# Patient Record
Sex: Female | Born: 1980 | Race: Black or African American | Hispanic: No | Smoking: Former smoker
Health system: Southern US, Community
[De-identification: ages and names within clinical notes are randomized; demographics above are authoritative.]

## PROBLEM LIST (undated history)

## (undated) DIAGNOSIS — E119 Type 2 diabetes mellitus without complications: Secondary | ICD-10-CM

## (undated) DIAGNOSIS — M542 Cervicalgia: Secondary | ICD-10-CM

## (undated) DIAGNOSIS — D649 Anemia, unspecified: Secondary | ICD-10-CM

## (undated) DIAGNOSIS — F32A Depression, unspecified: Secondary | ICD-10-CM

## (undated) DIAGNOSIS — I10 Essential (primary) hypertension: Secondary | ICD-10-CM

## (undated) DIAGNOSIS — M549 Dorsalgia, unspecified: Secondary | ICD-10-CM

## (undated) HISTORY — PX: OTHER SURGICAL HISTORY: SHX169

## (undated) HISTORY — PX: ABLATION: SHX5711

## (undated) HISTORY — PX: DILATION AND CURETTAGE OF UTERUS: SHX78

## (undated) HISTORY — DX: Cervicalgia: M54.2

## (undated) HISTORY — DX: Dorsalgia, unspecified: M54.9

---
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
## 2018-07-11 DIAGNOSIS — E1043 Type 1 diabetes mellitus with diabetic autonomic (poly)neuropathy: Secondary | ICD-10-CM | POA: Insufficient documentation

## 2020-07-31 ENCOUNTER — Emergency Department
Admission: EM | Admit: 2020-07-31 | Discharge: 2020-07-31 | Disposition: A | Discharge status: Home / Self Care | Payer: Managed Care, Other (non HMO) | Attending: Emergency Medicine | Admitting: Emergency Medicine

## 2020-07-31 ENCOUNTER — Other Ambulatory Visit: Payer: Self-pay

## 2020-07-31 ENCOUNTER — Encounter: Payer: Self-pay | Admitting: Emergency Medicine

## 2020-07-31 DIAGNOSIS — B379 Candidiasis, unspecified: Secondary | ICD-10-CM | POA: Diagnosis not present

## 2020-07-31 DIAGNOSIS — A6004 Herpesviral vulvovaginitis: Secondary | ICD-10-CM | POA: Diagnosis not present

## 2020-07-31 DIAGNOSIS — N760 Acute vaginitis: Secondary | ICD-10-CM

## 2020-07-31 DIAGNOSIS — B9689 Other specified bacterial agents as the cause of diseases classified elsewhere: Secondary | ICD-10-CM | POA: Insufficient documentation

## 2020-07-31 DIAGNOSIS — N898 Other specified noninflammatory disorders of vagina: Secondary | ICD-10-CM | POA: Diagnosis present

## 2020-07-31 LAB — URINALYSIS, COMPLETE (UACMP) WITH MICROSCOPIC
Bilirubin Urine: NEGATIVE
Glucose, UA: 50 mg/dL — AB
Hgb urine dipstick: NEGATIVE
Ketones, ur: NEGATIVE mg/dL
Nitrite: NEGATIVE
Protein, ur: NEGATIVE mg/dL
Specific Gravity, Urine: 1.015 (ref 1.005–1.030)
pH: 6 (ref 5.0–8.0)

## 2020-07-31 LAB — WET PREP, GENITAL
Sperm: NONE SEEN
Trich, Wet Prep: NONE SEEN
Yeast Wet Prep HPF POC: NONE SEEN

## 2020-07-31 LAB — CHLAMYDIA/NGC RT PCR (ARMC ONLY)
Chlamydia Tr: NOT DETECTED
N gonorrhoeae: NOT DETECTED

## 2020-07-31 MED ORDER — VALACYCLOVIR HCL 1 G PO TABS: 1000.0000 mg | ORAL_TABLET | Freq: Two times a day (BID) | ORAL | 0 refills | Status: AC

## 2020-07-31 MED ORDER — FLUCONAZOLE 150 MG PO TABS: 150.0000 mg | ORAL_TABLET | Freq: Once | ORAL | 0 refills | Status: AC

## 2020-07-31 MED ORDER — METRONIDAZOLE 500 MG PO TABS: 500.0000 mg | ORAL_TABLET | Freq: Two times a day (BID) | ORAL | 0 refills | Status: AC

## 2020-07-31 NOTE — ED Triage Notes (Signed)
Pt reports an abscess, bump or something on her vaginal area for the last week. Pt reports area is getting painful. Provider in room to see pt with RN

## 2020-07-31 NOTE — Discharge Instructions (Addendum)
You were seen today for a vaginal ulcer, vaginal discharge and itching.  Your exam is consistent with genital herpes and bacterial vaginosis.  I am putting you on Valtrex which is an antiviral to treat the genital herpes twice daily for the next 7 days.  I will also put you on an antibiotic twice daily for the next 7 days for the bacterial infection. Avoid alcohol while on this antibiotic. I have sent in a Diflucan for you for antibiotic induced yeast infection.  Please follow-up with your PCP at your new patient appointment to discuss this further.

## 2020-07-31 NOTE — ED Notes (Signed)
Pt reports vaginal pain and irritation. Pt states that there is a bump in her vaginal area that is very sore. Pt states that this has been on going since Friday. Pt is in NAD.

## 2020-07-31 NOTE — ED Provider Notes (Signed)
Emergency Medicine Provider Triage Evaluation Note  Mary Barry , a 40 y.o. female  was evaluated in triage.  Pt complains of bump to groin area. She is concerned she may have herpes vs abscess. She reports worsening of pain over the last few days. Denies concern for STD but requests testing for them. Reports dysuria only with it running over the bump, denies urethral discomfort, vaginal discharge, or abdominal pain or fevers.  Review of Systems  Positive: + "bump" in groin Negative: Fevers, urethral discomfort, vaginal discharge  Physical Exam  BP 126/82 (BP Location: Left Arm)   Pulse 66   Resp 20   Ht 5\' 7"  (1.702 m)   Wt 111.1 kg   SpO2 100%   BMI 38.37 kg/m  Gen:   Awake, no distress  Resp:  Normal effort MSK:   Moves extremities without difficulty Other:    Medical Decision Making  Medically screening exam initiated at 7:43 AM.  Appropriate orders placed.  Mary Barry was informed that the remainder of the evaluation will be completed by another provider, this initial triage assessment does not replace that evaluation, and the importance of remaining in the ED until their evaluation is complete.     Noel Journey, PA 07/31/20 1017    08/02/20, MD 07/31/20 1504

## 2020-07-31 NOTE — ED Provider Notes (Addendum)
[facility] Emergency Department Provider Note ____________________________________________  Time seen: 1130  I have reviewed the triage vital signs and the nursing notes.  HISTORY  Chief Complaint  Abscess   HPI Mary Barry is a 40 y.o. female presents to the ER today with complaint of a bump in her vaginal area.  She noticed this 2 days ago.  She reports the bump is tender to touch.  She has not noticed any drainage from the area.  She reports she shaved 1 week ago and is unsure if this caused this bump to appear.  She reports some associated vaginal discharge and itching.  She denies pelvic pain, urinary frequency, urinary urgency, dysuria, blood in her urine or vaginal odor.  Her last menstrual period was 5 years ago, status post uterine ablation.  She has not taken any medications OTC for this.  She is not a sexually monogamous relationship.  History reviewed. No pertinent past medical history.  There are no problems to display for this patient.   History reviewed. No pertinent surgical history.  Prior to Admission medications   Medication Sig Start Date End Date Taking? Authorizing Provider  fluconazole (DIFLUCAN) 150 MG tablet Take 1 tablet (150 mg total) by mouth once for 1 dose. 07/31/20 07/31/20 Yes Tiquan Bouch, Salvadore Oxford, NP  metroNIDAZOLE (FLAGYL) 500 MG tablet Take 1 tablet (500 mg total) by mouth 2 (two) times daily for 7 days. 07/31/20 08/07/20 Yes Reymundo Winship, Salvadore Oxford, NP  valACYclovir (VALTREX) 1000 MG tablet Take 1 tablet (1,000 mg total) by mouth 2 (two) times daily for 7 days. 07/31/20 08/07/20 Yes BaitySalvadore Oxford, NP    Allergies Patient has no allergy information on record.  No family history on file.  Social History    Review of Systems  Constitutional: Negative for fever, chills or body. Cardiovascular: Negative for chest pain or chest tightness. Respiratory: Negative for cough or shortness of breath. Gastrointestinal: Negative for abdominal or  pelvic pain. Genitourinary: Positive for vaginal discharge and itching.  Negative for urinary urgency, frequency, dysuria, blood in her urine or vaginal odor. Musculoskeletal: Negative for back pain. Skin: Positive for lesion in the vaginal area. Neurological: Negative for headaches, focal weakness, tingling or numbness. ____________________________________________  PHYSICAL EXAM:  VITAL SIGNS: ED Triage Vitals  Enc Vitals Group     BP 07/31/20 0742 126/82     Pulse Rate 07/31/20 0742 66     Resp 07/31/20 0742 20     Temp 07/31/20 0744 98.5 F (36.9 C)     Temp Source 07/31/20 0744 Oral     SpO2 07/31/20 0742 100 %     Weight 07/31/20 0742 245 lb (111.1 kg)     Height 07/31/20 0742 5\' 7"  (1.702 m)     Head Circumference --      Peak Flow --      Pain Score 07/31/20 0742 2     Pain Loc --      Pain Edu? --      Excl. in GC? --     Constitutional: Alert and oriented.  Obese, in no distress. Head: Normocephalic Eyes: Normal extraocular movements Hematological/Lymphatic/Immunological: No femoral lymphadenopathy. Cardiovascular: Normal rate, regular rhythm.  Respiratory: Normal respiratory effort. No wheezes/rales/rhonchi. Gastrointestinal: Soft and nontender.  Active bowel sounds. Pelvic: Grouped ulcerations noted on clitoral hood. Musculoskeletal: No difficulty with gait. Neurologic:  Normal speech and language. No gross focal neurologic deficits are appreciated. ____________________________________________    Labs Reviewed  WET PREP, GENITAL - Abnormal;  Notable for the following components:      Result Value   Clue Cells Wet Prep HPF POC PRESENT (*)    WBC, Wet Prep HPF POC FEW (*)    All other components within normal limits  URINALYSIS, COMPLETE (UACMP) WITH MICROSCOPIC - Abnormal; Notable for the following components:   Color, Urine YELLOW (*)    APPearance HAZY (*)    Glucose, UA 50 (*)    Leukocytes,Ua TRACE (*)    Bacteria, UA FEW (*)    All other components  within normal limits  CHLAMYDIA/NGC RT PCR ([facility] ONLY)             ____________________________________________  INITIAL IMPRESSION / ASSESSMENT AND PLAN / ED COURSE  Vaginal Ulceration, Vaginal Discharge, Vaginal Itching:  DDx include genital herpes, bacterial vaginosis, yeast vaginitis, gonorrhea, chlamydia, trichomonas Urinalysis normal Wet prep positive for clue cells indicating bacterial vaginitis Exam consistent with genital herpes RX for Valtrex 1 gm PO BID x 7 days RX for Metronidazole 500 BID x 7 days She requests Fluconazole 150 mg PO x 1 for antibiotic induced yeast infection Advised her to follow up with her PCP for further evaluation if needed ____________________________________________  FINAL CLINICAL IMPRESSION(S) / ED DIAGNOSES  Final diagnoses:  Herpes simplex vulvovaginitis  Bacterial vaginitis  Antibiotic-induced yeast infection      Lorre Munroe, NP 07/31/20 1214    Lorre Munroe, NP 07/31/20 1234    Chesley Noon, MD 07/31/20 1524

## 2020-09-06 DIAGNOSIS — G8929 Other chronic pain: Secondary | ICD-10-CM

## 2020-09-06 DIAGNOSIS — I517 Cardiomegaly: Secondary | ICD-10-CM

## 2020-09-06 HISTORY — DX: Cardiomegaly: I51.7

## 2020-09-06 HISTORY — DX: Other chronic pain: G89.29

## 2021-01-25 ENCOUNTER — Other Ambulatory Visit: Payer: Self-pay | Admitting: Gastroenterology

## 2021-01-25 DIAGNOSIS — R1012 Left upper quadrant pain: Secondary | ICD-10-CM

## 2021-01-25 DIAGNOSIS — R1011 Right upper quadrant pain: Secondary | ICD-10-CM

## 2021-02-10 ENCOUNTER — Encounter
Admission: RE | Admit: 2021-02-10 | Discharge: 2021-02-10 | Disposition: A | Discharge status: Home / Self Care | Payer: BC Managed Care – PPO | Source: Ambulatory Visit | Attending: Gastroenterology | Admitting: Gastroenterology

## 2021-02-10 DIAGNOSIS — R1012 Left upper quadrant pain: Secondary | ICD-10-CM | POA: Diagnosis present

## 2021-02-10 DIAGNOSIS — R1011 Right upper quadrant pain: Secondary | ICD-10-CM | POA: Diagnosis present

## 2021-02-10 IMAGING — NM NM GASTRIC EMPTYING
5 series · 16 of 16 positions shown · non-contrast
Comparison: None.

CLINICAL DATA: Abdominal pain.

EXAM:
NUCLEAR MEDICINE GASTRIC EMPTYING SCAN
TECHNIQUE: After oral ingestion of radiolabeled meal, sequential abdominal
images were obtained for 4 hours. Percentage of activity emptying
the stomach was calculated at 1 hour, 2 hour, 3 hour, and 4 hours.
RADIOPHARMACEUTICALS:  2.27 mCi [T1] sulfur colloid in
standardized meal

[Series 1000: gastric statics · 3.90mm/px · 2 of 2 frames shown (1 of 4)]
[frame 1/2]
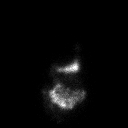
[frame 2/2]
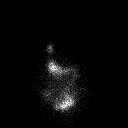

[Series 1000: gastric statics · 3.90mm/px · 2 of 2 frames shown (2 of 4)]
[frame 1/2]
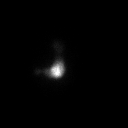
[frame 2/2]
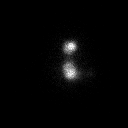

[Series 1000: gastric statics (results) · 3.90mm/px · 4 acquisitions, 8 frames shown]
[im 1/4]
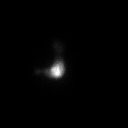
[im 1/4]
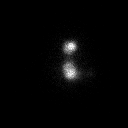
[im 2/4]
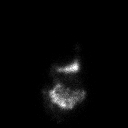
[im 2/4]
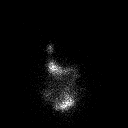
[im 3/4]
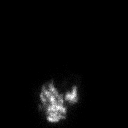
[im 3/4]
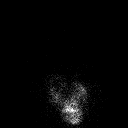
[im 4/4]
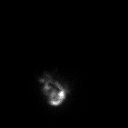
[im 4/4]
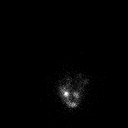

[Series 1000: gastric statics · 3.90mm/px · 2 of 2 frames shown (3 of 4)]
[frame 1/2]
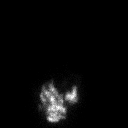
[frame 2/2]
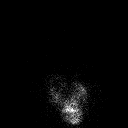

[Series 1000: gastric statics · 3.90mm/px · 2 of 2 frames shown (4 of 4)]
[frame 1/2]
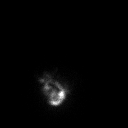
[frame 2/2]
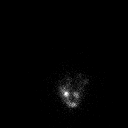

[16 of 16 positions shown; findings below may reference images not displayed]

FINDINGS: Expected location of the stomach in the left upper quadrant.
Ingested meal empties the stomach gradually over the course of the
study.

74% emptied at 1 hr ( normal >= 10%)

99% emptied at 2 hr ( normal >= 40%)

99% emptied at 3 hr ( normal >= 70%)
IMPRESSION: Normal gastric emptying study.

## 2021-02-10 MED ORDER — TECHNETIUM TC 99M SULFUR COLLOID
2.0000 | Freq: Once | INTRAVENOUS | Status: AC
  Administered 2021-02-10: 08:00:00 2.27 via ORAL

## 2021-09-25 ENCOUNTER — Other Ambulatory Visit: Payer: Self-pay | Admitting: Physical Medicine & Rehabilitation

## 2021-09-25 DIAGNOSIS — M546 Pain in thoracic spine: Secondary | ICD-10-CM

## 2021-09-29 ENCOUNTER — Ambulatory Visit
Admission: RE | Admit: 2021-09-29 | Discharge: 2021-09-29 | Disposition: A | Discharge status: Home / Self Care | Payer: BC Managed Care – PPO | Source: Ambulatory Visit | Attending: Physical Medicine & Rehabilitation | Admitting: Physical Medicine & Rehabilitation

## 2021-09-29 DIAGNOSIS — M546 Pain in thoracic spine: Secondary | ICD-10-CM | POA: Insufficient documentation

## 2021-10-03 ENCOUNTER — Other Ambulatory Visit: Payer: Self-pay | Admitting: Physical Medicine & Rehabilitation

## 2021-10-03 DIAGNOSIS — M542 Cervicalgia: Secondary | ICD-10-CM

## 2021-10-04 DIAGNOSIS — I10 Essential (primary) hypertension: Secondary | ICD-10-CM | POA: Insufficient documentation

## 2021-10-11 ENCOUNTER — Ambulatory Visit
Admission: RE | Admit: 2021-10-11 | Discharge: 2021-10-11 | Disposition: A | Discharge status: Home / Self Care | Payer: BC Managed Care – PPO | Source: Ambulatory Visit | Attending: Physical Medicine & Rehabilitation | Admitting: Physical Medicine & Rehabilitation

## 2021-10-11 DIAGNOSIS — M542 Cervicalgia: Secondary | ICD-10-CM | POA: Insufficient documentation

## 2022-11-01 ENCOUNTER — Encounter: Payer: Self-pay | Admitting: Family Medicine

## 2022-11-01 DIAGNOSIS — Z1231 Encounter for screening mammogram for malignant neoplasm of breast: Secondary | ICD-10-CM

## 2023-04-09 ENCOUNTER — Telehealth: Payer: Self-pay | Admitting: Plastic Surgery

## 2023-04-09 NOTE — Telephone Encounter (Signed)
 called due to Dr D in sx pt agreed to come in at 330 pm to see Dr D called 04-09-23)

## 2023-04-10 ENCOUNTER — Ambulatory Visit: Payer: BC Managed Care – PPO | Admitting: Plastic Surgery

## 2023-04-10 VITALS — BP 119/71 | HR 88 | Ht 67.0 in | Wt 243.0 lb

## 2023-04-10 DIAGNOSIS — N62 Hypertrophy of breast: Secondary | ICD-10-CM

## 2023-04-10 DIAGNOSIS — F17210 Nicotine dependence, cigarettes, uncomplicated: Secondary | ICD-10-CM

## 2023-04-10 DIAGNOSIS — G8929 Other chronic pain: Secondary | ICD-10-CM

## 2023-04-10 DIAGNOSIS — M542 Cervicalgia: Secondary | ICD-10-CM

## 2023-04-10 DIAGNOSIS — M545 Low back pain, unspecified: Secondary | ICD-10-CM

## 2023-04-10 DIAGNOSIS — M546 Pain in thoracic spine: Secondary | ICD-10-CM

## 2023-04-10 DIAGNOSIS — M549 Dorsalgia, unspecified: Secondary | ICD-10-CM | POA: Insufficient documentation

## 2023-04-10 DIAGNOSIS — Z803 Family history of malignant neoplasm of breast: Secondary | ICD-10-CM

## 2023-04-10 DIAGNOSIS — Z6837 Body mass index (BMI) 37.0-37.9, adult: Secondary | ICD-10-CM

## 2023-04-10 HISTORY — DX: Cervicalgia: M54.2

## 2023-04-10 HISTORY — DX: Hypertrophy of breast: N62
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
## 2023-06-06 ENCOUNTER — Other Ambulatory Visit: Payer: Self-pay

## 2023-06-06 ENCOUNTER — Encounter: Payer: Self-pay | Admitting: Physical Therapy

## 2023-06-06 ENCOUNTER — Ambulatory Visit: Attending: Plastic Surgery | Admitting: Physical Therapy

## 2023-06-06 DIAGNOSIS — G8929 Other chronic pain: Secondary | ICD-10-CM | POA: Diagnosis not present

## 2023-06-06 DIAGNOSIS — M542 Cervicalgia: Secondary | ICD-10-CM | POA: Insufficient documentation

## 2023-06-06 DIAGNOSIS — N62 Hypertrophy of breast: Secondary | ICD-10-CM | POA: Diagnosis not present

## 2023-06-06 DIAGNOSIS — M546 Pain in thoracic spine: Secondary | ICD-10-CM | POA: Insufficient documentation

## 2023-06-06 DIAGNOSIS — R293 Abnormal posture: Secondary | ICD-10-CM | POA: Insufficient documentation

## 2023-06-06 DIAGNOSIS — M6281 Muscle weakness (generalized): Secondary | ICD-10-CM | POA: Insufficient documentation
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
## 2023-06-19 ENCOUNTER — Ambulatory Visit: Attending: Plastic Surgery | Admitting: Physical Therapy

## 2023-06-19 ENCOUNTER — Encounter: Payer: Self-pay | Admitting: Physical Therapy

## 2023-06-19 DIAGNOSIS — R293 Abnormal posture: Secondary | ICD-10-CM | POA: Diagnosis present

## 2023-06-19 DIAGNOSIS — M546 Pain in thoracic spine: Secondary | ICD-10-CM | POA: Diagnosis present

## 2023-06-19 DIAGNOSIS — M542 Cervicalgia: Secondary | ICD-10-CM | POA: Insufficient documentation

## 2023-06-19 DIAGNOSIS — M6281 Muscle weakness (generalized): Secondary | ICD-10-CM | POA: Insufficient documentation
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
## 2023-06-25 ENCOUNTER — Ambulatory Visit: Admitting: Physical Therapy

## 2023-07-05 ENCOUNTER — Ambulatory Visit: Admitting: Physical Therapy

## 2023-07-05 ENCOUNTER — Encounter: Payer: Self-pay | Admitting: Physical Therapy

## 2023-07-05 DIAGNOSIS — M546 Pain in thoracic spine: Secondary | ICD-10-CM

## 2023-07-05 DIAGNOSIS — M542 Cervicalgia: Secondary | ICD-10-CM
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
## 2023-07-09 ENCOUNTER — Ambulatory Visit: Payer: BC Managed Care – PPO | Admitting: Student

## 2023-07-09 VITALS — BP 134/85 | HR 87 | Wt 248.0 lb

## 2023-07-09 DIAGNOSIS — M546 Pain in thoracic spine: Secondary | ICD-10-CM

## 2023-07-09 DIAGNOSIS — N62 Hypertrophy of breast: Secondary | ICD-10-CM

## 2023-07-09 DIAGNOSIS — G8929 Other chronic pain: Secondary | ICD-10-CM

## 2023-07-09 NOTE — Progress Notes (Signed)
   Referring Provider Lenell Query, PA-C [id number] [place],  [place] [address]   CC:  Chief Complaint  Patient presents with   Follow-up      Mary Barry is an 43 y.o. female.  HPI: Patient is a 43 year old female with history of macromastia.  She presents to the clinic today to further discuss optimizing her for surgery.  Patient was seen for initial consult by Dr. Orin Birk on 04/10/2023.  At this visit, she complained of back and neck pain due to her large breasts.  Her BMI at that time was 37.6 kg/m.  Her STN was 36 cm bilaterally.  Her preoperative bra size was a DDD cup.  The patient reported she would like to be a B cup.  The amount of excess breast tissue to be removed at time of surgery was 800 g bilaterally.  Patient reported she was a current smoker.  Patient also stated that she was an insulin-dependent diabetic.  She was possibly starting on weight loss medication that would also help with her diabetes, but had not started yet.  Her most recent hemoglobin A1c prior to the visit was 7.4.  It was discussed with the patient that she had to be 3 months tobacco free prior to any surgery and that her hemoglobin A1c would need to be under 7.  Per chart review, patient's most recent Hgb A1C was 7.2 on 06/25/23.   Today, patient reports she is doing well. She states that she has been going to PT, but still has one more session left. She reports that although she has been going to PT, she still experiencing back and neck pain. Patient also states that she gets rashes underneath her breasts intermittently. She denies any recent fevers, chills or changes in her health. She states she would like to move forward with a breast reduction.   Patient also states that she quit smoking on March 1st. In terms of her A1C, patient states she has been working with her endocrinologist on this   Review of Systems General: Back and neck pain, intermittent rashes, denies any fevers  or chills   Physical Exam    07/09/2023    3:46 PM 04/10/2023    3:12 PM 07/31/2020   12:16 PM  Vitals with BMI  Height  5\' 7"    Weight 248 lbs 243 lbs   BMI  38.05   Systolic 134 119 956  Diastolic 85 71 85  Pulse 87 88 80    General:  No acute distress,  Alert and oriented, Non-Toxic, Normal speech and affect Psych: Normal behavior, normal mood Pulmonary: Unlabored breathing, no respiratory distress MSK: ambulatory   Assessment/Plan  Chronic bilateral thoracic back pain   Discussed with the patient that given her A1c is still elevated we cannot yet schedule surgery. I  discussed with her that an elevated A1c may put her at risk of complications after the surgery. Patient expressed understanding.   Patient states that she is seeing her endocrinologist again in August and will have a repeat A1c at that visit. Recommended that we do a telephone visit after her endocrinologist appointment to see if her A1c has lowered. If it has, discussed with patient we can submit to insurance. She expressed understanding.   Instructed the patient to call in the meantime if she has any questions or concerns.   [name] 07/09/2023, 4:12 PM

## 2023-07-10 ENCOUNTER — Encounter: Payer: Self-pay | Admitting: Physical Therapy

## 2023-07-10 ENCOUNTER — Ambulatory Visit: Admitting: Physical Therapy

## 2023-07-10 DIAGNOSIS — R293 Abnormal posture: Secondary | ICD-10-CM

## 2023-07-10 DIAGNOSIS — M542 Cervicalgia: Secondary | ICD-10-CM

## 2023-07-10 DIAGNOSIS — M546 Pain in thoracic spine: Secondary | ICD-10-CM

## 2023-07-10 DIAGNOSIS — M6281 Muscle weakness (generalized): Secondary | ICD-10-CM
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
## 2023-09-13 ENCOUNTER — Other Ambulatory Visit: Payer: Self-pay | Admitting: Family Medicine

## 2023-09-13 DIAGNOSIS — R9389 Abnormal findings on diagnostic imaging of other specified body structures: Secondary | ICD-10-CM

## 2023-09-13 DIAGNOSIS — M542 Cervicalgia: Secondary | ICD-10-CM

## 2023-09-16 ENCOUNTER — Inpatient Hospital Stay
Admission: RE | Admit: 2023-09-16 | Discharge: 2023-09-16 | Disposition: A | Discharge status: Home / Self Care | Payer: Self-pay | Source: Ambulatory Visit | Attending: Orthopedic Surgery | Admitting: Orthopedic Surgery

## 2023-09-16 ENCOUNTER — Other Ambulatory Visit: Payer: Self-pay | Admitting: Family Medicine

## 2023-09-16 DIAGNOSIS — Z049 Encounter for examination and observation for unspecified reason: Secondary | ICD-10-CM

## 2023-09-17 ENCOUNTER — Ambulatory Visit
Admission: RE | Admit: 2023-09-17 | Discharge: 2023-09-17 | Disposition: A | Discharge status: Home / Self Care | Source: Ambulatory Visit | Attending: Family Medicine | Admitting: Family Medicine

## 2023-09-17 DIAGNOSIS — M542 Cervicalgia: Secondary | ICD-10-CM | POA: Diagnosis present

## 2023-09-17 DIAGNOSIS — R9389 Abnormal findings on diagnostic imaging of other specified body structures: Secondary | ICD-10-CM | POA: Diagnosis present
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
## 2023-09-19 ENCOUNTER — Encounter: Payer: Self-pay | Admitting: Orthopedic Surgery

## 2023-09-19 ENCOUNTER — Telehealth: Payer: Self-pay | Admitting: Orthopedic Surgery

## 2023-09-19 ENCOUNTER — Inpatient Hospital Stay
Admission: RE | Admit: 2023-09-19 | Discharge: 2023-09-19 | Disposition: A | Discharge status: Home / Self Care | Payer: Self-pay | Source: Ambulatory Visit | Attending: Orthopedic Surgery | Admitting: Orthopedic Surgery

## 2023-09-19 ENCOUNTER — Ambulatory Visit: Admitting: Orthopedic Surgery

## 2023-09-19 VITALS — BP 132/78 | Ht 67.0 in | Wt 253.0 lb

## 2023-09-19 DIAGNOSIS — Z049 Encounter for examination and observation for unspecified reason: Secondary | ICD-10-CM

## 2023-09-19 DIAGNOSIS — Z981 Arthrodesis status: Secondary | ICD-10-CM

## 2023-09-19 DIAGNOSIS — M4802 Spinal stenosis, cervical region: Secondary | ICD-10-CM

## 2023-09-19 DIAGNOSIS — H539 Unspecified visual disturbance: Secondary | ICD-10-CM

## 2023-09-19 DIAGNOSIS — M4722 Other spondylosis with radiculopathy, cervical region: Secondary | ICD-10-CM | POA: Diagnosis not present

## 2023-09-19 DIAGNOSIS — M5412 Radiculopathy, cervical region: Secondary | ICD-10-CM

## 2023-09-19 DIAGNOSIS — M47812 Spondylosis without myelopathy or radiculopathy, cervical region: Secondary | ICD-10-CM

## 2023-09-19 NOTE — Telephone Encounter (Signed)
 Patient advised.

## 2023-09-19 NOTE — Telephone Encounter (Signed)
 Please call and let her know that PCP Hezzie Quan PA-C) said she can take aspirin 81mg  daily.   Thanks!

## 2023-09-19 NOTE — Patient Instructions (Signed)
 It was so nice to see you today. Thank you so much for coming in.    You have some wear and tear in your neck and I am worried your fusion may not be healed.   I want to get a CT of your neck to look into things further. I also want to get an CTA of your head and neck to look into the vessels.   We will get this approved through your insurance and [facility] will call you to schedule the appointment.   We want you to start aspirin 81mg  daily, but want to get clearance from your PCP. I sent him a message. Do not start it until you back from him.   I put in an urgent referral to [facility] for further evaluation. They should call you to schedule an appointment or you can call them at [phone].   Once I have the results back, we will schedule you a follow up.   Please do not hesitate to call if you have any questions or concerns. You can also message me in MyChart.   Glade Boys PA-C [phone]     The physicians and staff at [facility] at [facility] are committed to providing excellent care. You may receive a survey asking for feedback about your experience at our office. We value you your feedback and appreciate you taking the time to to fill it out. The [facility] leadership team is also available to discuss your experience in person, feel free to contact us  [phone].

## 2023-09-20 MED ORDER — DIAZEPAM 2 MG PO TABS: 2.0000 mg | ORAL_TABLET | Freq: Once | ORAL | 0 refills | Status: DC | PRN

## 2023-09-23 ENCOUNTER — Ambulatory Visit
Admission: RE | Admit: 2023-09-23 | Discharge: 2023-09-23 | Disposition: A | Discharge status: Home / Self Care | Source: Ambulatory Visit | Attending: Orthopedic Surgery | Admitting: Orthopedic Surgery

## 2023-09-23 DIAGNOSIS — Z049 Encounter for examination and observation for unspecified reason: Secondary | ICD-10-CM | POA: Diagnosis present

## 2023-09-23 DIAGNOSIS — M47812 Spondylosis without myelopathy or radiculopathy, cervical region: Secondary | ICD-10-CM | POA: Insufficient documentation

## 2023-09-23 DIAGNOSIS — H539 Unspecified visual disturbance: Secondary | ICD-10-CM | POA: Insufficient documentation

## 2023-09-23 DIAGNOSIS — M5412 Radiculopathy, cervical region: Secondary | ICD-10-CM | POA: Insufficient documentation

## 2023-09-23 DIAGNOSIS — Z981 Arthrodesis status: Secondary | ICD-10-CM | POA: Insufficient documentation

## 2023-09-23 MED ORDER — IOHEXOL 350 MG/ML SOLN
75.0000 mL | Freq: Once | INTRAVENOUS | Status: AC | PRN
  Administered 2023-09-23 (×2): 75 mL via INTRAVENOUS

## 2023-09-24 ENCOUNTER — Encounter: Payer: Self-pay | Admitting: Neurology
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
## 2023-09-30 ENCOUNTER — Telehealth: Payer: Self-pay | Admitting: Orthopedic Surgery

## 2023-09-30 NOTE — Telephone Encounter (Signed)
 CTA of head and neck dated 09/23/23:  FINDINGS:   CT HEAD:   BRAIN: No acute intraparenchymal hemorrhage. No mass lesion. No CT evidence for acute territorial infarct. No midline shift or extra-axial collection.   VENTRICLES: No hydrocephalus.   ORBITS: The orbits are unremarkable.   SINUSES AND MASTOIDS: The paranasal sinuses and mastoid air cells are clear.   CTA NECK:   COMMON CAROTID ARTERIES: No significant stenosis. No dissection or occlusion.   INTERNAL CAROTID ARTERIES: No stenosis by NASCET criteria. Minimal atherosclerotic calcification in the carotid siphons without significant stenosis. No dissection or occlusion.   VERTEBRAL ARTERIES: No significant stenosis. No dissection or occlusion.   CTA HEAD:   ANTERIOR CEREBRAL ARTERIES: No significant stenosis. No occlusion. No aneurysm.   MIDDLE CEREBRAL ARTERIES: No significant stenosis. No occlusion. No aneurysm.   POSTERIOR CEREBRAL ARTERIES: No significant stenosis. No occlusion. No aneurysm. There are small posterior communicating arteries bilaterally.   BASILAR ARTERY: No significant stenosis. No occlusion. No aneurysm.   OTHER:   SOFT TISSUES: No acute finding. No mass or lymphadenopathy.   BONES: C5 to C7 ACDF. Right greater than left neural foraminal stenosis at C6-7 due to uncovertebral spurring as detailed on the recent prior MRI.   IMPRESSION: 1. No large vessel occlusion, significant stenosis, dissection, or aneurysm in the head and neck. 2. Unremarkable noncontrast CT appearance of the brain.   Electronically signed by: Dasie Hamburg MD 09/30/2023 03:25 PM EDT RP Workstation: [id number]   Called patient to discuss findings. She will keep scheduled appointment with neurology on 10/02/23.   Does not appear to be fused at C6-C7. Will reviewed with Dr. Clois and set up phone visit with her on Friday to discuss further.

## 2023-10-02 ENCOUNTER — Ambulatory Visit: Admitting: Neurology

## 2023-10-02 ENCOUNTER — Encounter: Payer: Self-pay | Admitting: Neurology

## 2023-10-02 VITALS — BP 138/74 | HR 82 | Resp 20 | Ht 67.0 in | Wt 254.0 lb

## 2023-10-02 DIAGNOSIS — M79602 Pain in left arm: Secondary | ICD-10-CM | POA: Diagnosis not present

## 2023-10-02 DIAGNOSIS — M542 Cervicalgia: Secondary | ICD-10-CM | POA: Diagnosis not present

## 2023-10-02 DIAGNOSIS — D649 Anemia, unspecified: Secondary | ICD-10-CM | POA: Insufficient documentation

## 2023-10-02 DIAGNOSIS — M5441 Lumbago with sciatica, right side: Secondary | ICD-10-CM

## 2023-10-02 DIAGNOSIS — M5442 Lumbago with sciatica, left side: Secondary | ICD-10-CM

## 2023-10-02 DIAGNOSIS — R29818 Other symptoms and signs involving the nervous system: Secondary | ICD-10-CM

## 2023-10-02 DIAGNOSIS — G43009 Migraine without aura, not intractable, without status migrainosus: Secondary | ICD-10-CM

## 2023-10-02 DIAGNOSIS — F32A Depression, unspecified: Secondary | ICD-10-CM | POA: Insufficient documentation

## 2023-10-02 DIAGNOSIS — G8929 Other chronic pain: Secondary | ICD-10-CM

## 2023-10-02 NOTE — Patient Instructions (Addendum)
 I saw you today for pain on your left side.   Neurosurgery was concerned there may be a torn blood vessel in your neck. Your recent scan did not show any evidence of this.  I will discuss options with neurosurgery including nerve testing or pain management referral.  We discussed a medication called nortriptyline  that can help migraines and pain in general. The most common side effects is sleepiness and dry mouth. You would like to think about this. Let me know if you'd like to try it.  If you have new difficulty speaking, face droop, numbness on one side of the body, weakness on one side of the body, or dizziness/imbalance, this could be the sign of a stroke. Don't wait, please call EMS and be evaluated at the nearest emergency room.   The physicians and staff at [facility] are committed to providing excellent care. You may receive a survey requesting feedback about your experience at our office. We strive to receive very good responses to the survey questions. If you feel that your experience would prevent you from giving the office a very good  response, please contact our office to try to remedy the situation. We may be reached at [phone]. Thank you for taking the time out of your busy day to complete the survey.  Venetia Potters, MD [facility]

## 2023-10-03 NOTE — Progress Notes (Unsigned)
 Telephone Visit- Progress Note: Referring Physician:  Cyrus Selinda Moose, PA-C [address] [place],  [place] [address]  Primary Physician:  Cyrus Selinda Moose, PA-C  This visit was performed via telephone.  Patient location: home Provider location: working from home  I spent a total of 20 minutes non-face-to-face activities for this visit on the date of this encounter including review of current clinical condition and response to treatment.    Patient has given verbal consent to this telephone visits and we reviewed the limitations of a telephone visit. Patient wishes to proceed.    Chief Complaint:  discuss results  History of Present Illness: Mary Barry is a 43 y.o. female has a history of DM, diabetic gastroparesis, cardiac hypertrophy, GERD, HTN, hyperlipidemia, gastric ulcers.    History of ACDF in 2020 and she did well.   Last seen by me on 09/19/23 for constant left sided neck pain with left arm pain into her fingers. Xrays showed possible lucency at C7 screw. She also has known moderate left foraminal stenosis C6-C7 as well.   She was sent to neurology for possible dissection. CTA of head and neck looked good. She saw Dr. Leigh on 10/02/23 and he discussed starting amitriptyline for headaches.   She continues with constant left sided neck pain with left arm into her fingers that is a little better but still constant. No right arm symptoms. She has numbness, tingling, and weakness in her left arm. She continues with intermittent headaches/pain. Still seeing black spots intermittently. She notes some swallowing issues as well that recently started when neck pain did. She did not have swallowing issues after her surgery.   She is diabetic. Last HgbA1c was 7.2 on 06/25/23. She was discharged from PT on 07/10/23.    Tobacco use:  Does not smoke.      Conservative measures:  Physical therapy: was discharged from PT on 07/10/23 for her neck and back.   Multimodal medical therapy including regular antiinflammatories: Flexeril, Gabapentin Injections: no epidural steroid injections   Past Surgery: History of ACDF in 2020.   Imajean Reaves Barry has no symptoms of cervical myelopathy.   The symptoms are causing a significant impact on the patient's life.    Exam: No exam done as this was a telephone encounter.     Imaging: CTA head and neck dated 09/23/23:  FINDINGS:   CT HEAD:   BRAIN: No acute intraparenchymal hemorrhage. No mass lesion. No CT evidence for acute territorial infarct. No midline shift or extra-axial collection.   VENTRICLES: No hydrocephalus.   ORBITS: The orbits are unremarkable.   SINUSES AND MASTOIDS: The paranasal sinuses and mastoid air cells are clear.   CTA NECK:   COMMON CAROTID ARTERIES: No significant stenosis. No dissection or occlusion.   INTERNAL CAROTID ARTERIES: No stenosis by NASCET criteria. Minimal atherosclerotic calcification in the carotid siphons without significant stenosis. No dissection or occlusion.   VERTEBRAL ARTERIES: No significant stenosis. No dissection or occlusion.   CTA HEAD:   ANTERIOR CEREBRAL ARTERIES: No significant stenosis. No occlusion. No aneurysm.   MIDDLE CEREBRAL ARTERIES: No significant stenosis. No occlusion. No aneurysm.   POSTERIOR CEREBRAL ARTERIES: No significant stenosis. No occlusion. No aneurysm. There are small posterior communicating arteries bilaterally.   BASILAR ARTERY: No significant stenosis. No occlusion. No aneurysm.   OTHER:   SOFT TISSUES: No acute finding. No mass or lymphadenopathy.   BONES: C5 to C7 ACDF. Right greater than left neural foraminal stenosis at C6-7 due  to uncovertebral spurring as detailed on the recent prior MRI.   IMPRESSION: 1. No large vessel occlusion, significant stenosis, dissection, or aneurysm in the head and neck. 2. Unremarkable noncontrast CT appearance of the brain.   Electronically  signed by: Dasie Hamburg MD 09/30/2023 03:25 PM EDT RP Workstation: [id number]  I have personally reviewed the images and agree with the above interpretation.  She does not appear to be fused at C6-C7. Above imaging reviewed with Dr. Clois prior to her visit.   Assessment and Plan: Ms. Hadessah Grennan has a history of ACDF C5-C7 in 2020. Did well after surgery.   She continues with constant left sided neck pain with left arm into her fingers that is a little better but still constant. No right arm symptoms. She has numbness, tingling, and weakness in her left arm. She continues with intermittent headaches/pain.    Xrays show possible lucency at C7 screw and in review of CTA she does not appear to be fused at C6-C7. She also has moderate left foraminal stenosis C6-C7 as well.    Neck and left arm pain likely cervical mediated. Headaches not likely cervical mediated, but may be exacerbated by cervical issues.    Treatment options discussed with patient and following plan made:   - She has failed conservative treatment including PT (was discharged), time, and medications.  - She will follow up with Dr. Clois to discuss possible surgery options. Appointment scheduled.  - She's having some new swallowing issues- not likely cervical mediated. May consider referral to ENT. Can discuss with Dr. Clois further.  - She does not smoke.  - Last HgbA1c on 06/25/23 was 7.2.  - Do not think EMG of upper extremities is necessary. Will discuss with Dr. Leigh.   Glade Boys PA-C Neurosurgery

## 2023-10-04 ENCOUNTER — Telehealth: Payer: Self-pay | Admitting: Neurology

## 2023-10-04 ENCOUNTER — Encounter: Payer: Self-pay | Admitting: Orthopedic Surgery

## 2023-10-04 ENCOUNTER — Other Ambulatory Visit: Payer: Self-pay | Admitting: Neurology

## 2023-10-04 ENCOUNTER — Ambulatory Visit (INDEPENDENT_AMBULATORY_CARE_PROVIDER_SITE_OTHER): Admitting: Orthopedic Surgery

## 2023-10-04 DIAGNOSIS — G8929 Other chronic pain: Secondary | ICD-10-CM

## 2023-10-04 DIAGNOSIS — S129XXA Fracture of neck, unspecified, initial encounter: Secondary | ICD-10-CM

## 2023-10-04 DIAGNOSIS — M47812 Spondylosis without myelopathy or radiculopathy, cervical region: Secondary | ICD-10-CM

## 2023-10-04 DIAGNOSIS — M79602 Pain in left arm: Secondary | ICD-10-CM

## 2023-10-04 DIAGNOSIS — Z981 Arthrodesis status: Secondary | ICD-10-CM | POA: Diagnosis not present

## 2023-10-04 DIAGNOSIS — M542 Cervicalgia: Secondary | ICD-10-CM

## 2023-10-04 DIAGNOSIS — M4802 Spinal stenosis, cervical region: Secondary | ICD-10-CM

## 2023-10-04 DIAGNOSIS — G43009 Migraine without aura, not intractable, without status migrainosus: Secondary | ICD-10-CM

## 2023-10-04 DIAGNOSIS — M5412 Radiculopathy, cervical region: Secondary | ICD-10-CM

## 2023-10-04 MED ORDER — NORTRIPTYLINE HCL 10 MG PO CAPS: 20.0000 mg | ORAL_CAPSULE | Freq: Every day | ORAL | 5 refills | Status: DC

## 2023-10-04 NOTE — Telephone Encounter (Signed)
 Pt called and LM with AN. She was seen yesterday by [facility]. She would like the medication that was prescribed to be sent in to her pharmacy On file

## 2023-10-07 NOTE — Progress Notes (Unsigned)
 Referring Physician:  Cyrus Selinda Moose, PA-C [id number] [place] [place] [employer],  [place] [address]  Primary Physician:  Cyrus Selinda Moose, PA-C  History of Present Illness: 10/08/2023 [name] is here today with a chief complaint of several years of neck pain.  She is also having issues with imbalance as well as numbness and discomfort into her arms.  She underwent an anterior cervical discectomy and fusion in 2020.  She has had worsening neck pain since that time.  10/04/2023 Note from [place], [place] -C Kaley Reaves Barry is a 43 y.o. female has a history of DM, diabetic gastroparesis, cardiac hypertrophy, GERD, HTN, hyperlipidemia, gastric ulcers.    History of ACDF in 2020 and she did well.    Last seen by me on 09/19/23 for constant left sided neck pain with left arm pain into her fingers. Xrays showed possible lucency at C7 screw. She also has known moderate left foraminal stenosis C6-C7 as well.    She was sent to neurology for possible dissection. CTA of head and neck looked good. She saw Dr. Leigh on 10/02/23 and he discussed starting amitriptyline for headaches.    She continues with constant left sided neck pain with left arm into her fingers that is a little better but still constant. No right arm symptoms. She has numbness, tingling, and weakness in her left arm. She continues with intermittent headaches/pain. Still seeing black spots intermittently. She notes some swallowing issues as well that recently started when neck pain did. She did not have swallowing issues after her surgery.    She is diabetic. Last HgbA1c was 7.2 on 06/25/23. She was discharged from PT on 07/10/23.    Tobacco use:  Does not smoke.      Conservative measures:  Physical therapy: was discharged from PT on 07/10/23 for her neck and back.  Multimodal medical therapy including regular antiinflammatories: Flexeril, Gabapentin Injections: no epidural steroid injections   Past Surgery:  History of ACDF in 2020.  Mirren Reaves Barry has symptoms of cervical myelopathy.  The symptoms are causing a significant impact on the patient's life.   I have utilized the care everywhere function in epic to review the outside records available from external health systems.   Review of Systems:  A 10 point review of systems is negative, except for the pertinent positives and negatives detailed in the HPI.  Past Medical History: Past Medical History:  Diagnosis Date   Back pain    Neck pain     Past Surgical History: Past Surgical History:  Procedure Laterality Date   ABLATION     BREAST MASS EXCISION Left 1993   cervical fusin     CESAREAN SECTION     DILATION AND CURETTAGE OF UTERUS      Allergies: Allergies as of 10/08/2023   (No Known Allergies)    Medications:  Current Outpatient Medications:    Biotin (SUPER BIOTIN) 5 MG TABS, by NG-Tube route daily before breakfast., Disp: , Rfl:    Continuous Glucose Sensor (DEXCOM G6 SENSOR) MISC, Apply topically., Disp: , Rfl:    Continuous Glucose Transmitter (DEXCOM G6 TRANSMITTER) MISC, SMARTSIG:1 Every 3 Months, Disp: , Rfl:    cyclobenzaprine (FLEXERIL) 10 MG tablet, TAKE ONE TABLET BY MOUTH EVERY 8 HOURS AS NEEDED FOR MUSCLE SPASMS, Disp: , Rfl:    diazepam  (VALIUM ) 2 MG tablet, Take 1 tablet (2 mg total) by mouth once as needed for up to 2 doses for anxiety (prior to  CT scan)., Disp: 2 tablet, Rfl: 0   fluticasone (FLONASE) 50 MCG/ACT nasal spray, INHALE 1 SPRAY INTO EACH NOSTRIL TWICE DAILY, Disp: , Rfl:    gabapentin (NEURONTIN) 300 MG capsule, Take 1 capsule by mouth 3 (three) times daily., Disp: , Rfl:    Insulin Disposable Pump (OMNIPOD 5 DEXG7G6 PODS GEN 5) MISC, APPLY 1 POD AS DIRECTED AND REPLACE POD EVERY 48 HOURS., Disp: , Rfl:    insulin lispro (HUMALOG) 100 UNIT/ML cartridge, USE UP TO 100 UNITS DAILY VIA OMNIPOD INSULIN PUMP, Disp: , Rfl:    Magnesium Gluconate 550 MG TABS, Take by mouth., Disp: , Rfl:     metFORMIN (GLUCOPHAGE-XR) 500 MG 24 hr tablet, Take 2 tablets by mouth 2 (two) times daily., Disp: , Rfl:    metoprolol tartrate (LOPRESSOR) 25 MG tablet, Take 25 mg by mouth 2 (two) times daily., Disp: , Rfl:    Multiple Vitamin (MULTI-VITAMIN) tablet, Take 1 tablet by mouth daily., Disp: , Rfl:    nortriptyline  (PAMELOR ) 10 MG capsule, Take 2 capsules (20 mg total) by mouth at bedtime. Take 1 capsule (10 mg) at bedtime for 1 week, then increase to 2 capsules (20 mg) at bedtime thereafter, Disp: 60 capsule, Rfl: 5   ondansetron (ZOFRAN) 4 MG tablet, TAKE 2 TABLETS TWICE A DAY BY ORAL ROUTE AS NEEDED., Disp: , Rfl:    pantoprazole (PROTONIX) 40 MG tablet, Take by mouth., Disp: , Rfl:    sucralfate (CARAFATE) 1 g tablet, Take by mouth., Disp: , Rfl:   Social History: Social History   Tobacco Use   Smoking status: Former    Types: Cigarettes   Smokeless tobacco: Never  Vaping Use   Vaping status: Never Used  Substance Use Topics   Alcohol use: Not Currently   Drug use: Never    Family Medical History: No family history on file.  Physical Examination: Vitals:   10/08/23 1451  BP: 128/60    General: Patient is in no apparent distress. Attention to examination is appropriate.  Neck:   Supple.  Full range of motion.  Respiratory: Patient is breathing without any difficulty.   NEUROLOGICAL:     Awake, alert, oriented to person, place, and time.  Speech is clear and fluent.   Cranial Nerves: Pupils equal round and reactive to light.  Facial tone is symmetric.  Facial sensation is symmetric. Shoulder shrug is symmetric. Tongue protrusion is midline.  There is no pronator drift.  Strength: Side Biceps Triceps Deltoid Interossei Grip Wrist Ext. Wrist Flex.  R 5 5 5 5 5 5 5   L 4 4 5 5  4+ 5 5   Side Iliopsoas Quads Hamstring PF DF EHL  R 5 5 5 5 5 5   L 5 5 5 5 5 5    Reflexes are 1+ and symmetric at the biceps, triceps, brachioradialis, patella and achilles.   Hoffman's is  present.   Bilateral upper and lower extremity sensation is intact to light touch.    No evidence of dysmetria noted.  Medical Decision Making  Imaging: MRI C spine 09/17/2023 IMPRESSION: 1. Prior C5-C7 ACDF. 2. Cervical spondylosis as outlined within the body of the report. At C7-T1, a previously demonstrated right center disc protrusion is no longer present. Findings have otherwise not significantly changed from the MRI of 10/11/2021. 3. At C6-C7, there is multifactorial mild-to-moderate spinal canal stenosis. Bilateral neural foraminal narrowing (severe right, moderate left). 4. At C5-C6, there is mild relative spinal canal stenosis (with slight flattening of  the ventral spinal cord). Bilateral neural foraminal narrowing (moderate right, mild left). 5. No significant spinal canal stenosis, and no more than mild neural foraminal narrowing, at the remaining levels. 6. Disc degeneration is greatest at C7-T1 (mild-to-moderate at this level). 7. Slight grade 1 anterolisthesis at C2-C3, C3-C4 and C4-C5.     Electronically Signed   By: Rockey Childs D.O.   On: 09/17/2023 17:55  I have personally reviewed the images and agree with the above interpretation.  Assessment and Plan: Ms. Keishla Oyer is a pleasant 43 y.o. 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  She has a pseudoarthrosis at C6-7.  She has severe foraminal stenosis at C5-6.  She has moderate central stenosis at C6-7 as well as a pseudoarthrosis at C6-7 I think she has significant symptoms secondary to both these issues.  I have recommended surgical intervention as she has had symptoms for more than 5 years.  There is no role for conservative management for cervical myelopathy.  I have recommended C5-7 posterior cervical fusion with decompression.  This will address both her pseudoarthrosis as well as her ongoing central stenosis at C6-7 and foraminal stenosis at  C5-6.  I discussed the planned procedure at length with the patient, including the risks, benefits, alternatives, and indications. The risks discussed include but are not limited to bleeding, infection, need for reoperation, spinal fluid leak, stroke, vision loss, anesthetic complication, coma, paralysis, and even death. I also described in detail that improvement was not guaranteed.  The patient expressed understanding of these risks, and asked that we proceed with surgery. I described the surgery in layman's terms, and gave ample opportunity for questions, which were answered to the best of my ability.  We also discussed that she may meet indications for breast reduction.  She will discuss that with my colleague Dr. Lowery next week.  I spent a total of 30 minutes in this patient's care today. This time was spent reviewing pertinent records including imaging studies, obtaining and confirming history, performing a directed evaluation, formulating and discussing my recommendations, and documenting the visit within the medical record.     Thank you for involving me in the care of this patient.      Anaissa Macfadden K. Clois MD, [facility] Neurosurgery

## 2023-10-07 NOTE — Telephone Encounter (Signed)
 Called pt to check on meds and remind her of the way to take it. She reported that she was still having headaches and I asked Dr. Skeet if this med needs time to work and he reported that it take a month to get in system well.

## 2023-10-08 ENCOUNTER — Ambulatory Visit (INDEPENDENT_AMBULATORY_CARE_PROVIDER_SITE_OTHER): Admitting: Neurosurgery

## 2023-10-08 VITALS — BP 128/60 | Ht 67.0 in | Wt 254.2 lb

## 2023-10-08 DIAGNOSIS — M5412 Radiculopathy, cervical region: Secondary | ICD-10-CM | POA: Diagnosis not present

## 2023-10-08 DIAGNOSIS — G959 Disease of spinal cord, unspecified: Secondary | ICD-10-CM | POA: Diagnosis not present

## 2023-10-08 DIAGNOSIS — M960 Pseudarthrosis after fusion or arthrodesis: Secondary | ICD-10-CM

## 2023-10-08 DIAGNOSIS — M4802 Spinal stenosis, cervical region: Secondary | ICD-10-CM | POA: Diagnosis not present

## 2023-10-08 NOTE — Patient Instructions (Signed)
 Please see below for information in regards to your upcoming surgery:   Planned surgery: C5-7 posterior spinal fusion with decompression   Surgery date: 11/20/23 at [facility] (Medical Mall: [address], [place], [place] [address]) - you will find out your arrival time the business day before your surgery.   Pre-op appointment at [facility] Pre-admit Testing: you will receive a call with a date/time for this appointment. If you are scheduled for an in person appointment, Pre-admit Testing is located on the first floor of the [facility] building, [address], [address]. During this appointment, they will advise you which medications you can take the morning of surgery, and which medications you will need to hold for surgery. Labs (such as blood work, EKG) may be done at your pre-op appointment. You are not required to fast for these labs. Should you need to change your pre-op appointment, please call Pre-admit testing at [phone].    Blood thinners:    Aspirin 81mg :  OK to stay on aspirin 81mg      Diabetes/heart failure/kidney disease/weight loss medications that require an extended hold: Per anesthesia guidelines (due to the increased risk of aspiration caused by delayed gastric emptying):   Metformin: hold for 2 days prior to surgery    Surgical clearance: we will send a clearance form to Selinda Quan, PA. They may wish to see you in their office prior to signing the clearance form. If so, they may call you to schedule an appointment.     NSAIDS (Non-steroidal anti-inflammatory drugs): because you are having a fusion, please avoid taking any NSAIDS (examples: ibuprofen, motrin, aleve, naproxen, meloxicam, diclofenac) for 3 months after surgery. Celebrex is an exception and is OK to take, if prescribed. Tylenol is not an NSAID.    Common restrictions after spine surgery: No bending, lifting, or twisting ("BLT"). Avoid lifting  objects heavier than 10 pounds for the first 6 weeks after surgery. Where possible, avoid household activities that involve lifting, bending, reaching, pushing, or pulling such as laundry, vacuuming, grocery shopping, and childcare. Try to arrange for help from friends and family for these activities while you heal. Do not drive while taking prescription pain medication. Weeks 6 through 12 after surgery: avoid lifting more than 25 pounds.    X-rays after surgery: Because you are having a fusion or arthroplasty: for appointments after your 2 week follow-up: please arrive at the [facility] outpatient imaging center ([address], [place]) or [facility] one hour prior to your appointment for x-rays. This applies to every appointment after your 2 week follow-up. Failure to do so may result in your appointment being rescheduled. *We recently started construction to have x-ray in our office. This may be completed by the time you come in for your 6 week post-op appointment. Please check with us  closer to that time to see if you can have your x-rays at our office*    How to contact us :  If you have any questions/concerns before or after surgery, you can reach us  at [phone], or you can send a mychart message. We can be reached by phone or mychart 8am-4pm, Monday-Friday.  *Please note: Calls after 4pm are forwarded to a third party answering service. Mychart messages are not routinely monitored during evenings, weekends, and holidays. Please call our office to contact the answering service for urgent concerns during non-business hours.   If you have FMLA/disability paperwork, please drop it off or fax  it to [phone]   Appointments/FMLA & disability paperwork: [employer] Registered Nurse/Surgery scheduler: Shefali Ng, RN Certified Medical Assistants: Don, CMA, Elenor, CMA, & Damien, CMA Physician Assistants: Lyle Decamp, PA-C, Edsel Goods, PA-C &  Glade Boys, PA-C Surgeons: Penne Sharps, MD & Reeves Daisy, MD   [facility] PREADMIT TESTING VISIT and SURGERY INFORMATION SHEET   Now that surgery has been scheduled you can anticipate several phone calls from [facility] services. A pharmacy technician will call you to verify your current list of medications taken at home.               The Pre-Service Center will call to verify your insurance information and to give you billing estimates and information.             The Preadmit Testing Office will be calling to schedule a visit to obtain information for the anesthesia team and provide instructions on preparation for surgery.  What can you expect for the Preadmit Testing Visit: Appointments may be scheduled in-person or by telephone.  If a telephone visit is scheduled, you may be asked to come into the office to have lab tests or other studies performed.   This visit will not be completed any greater than 14 days prior to your surgery.  If your surgery has been scheduled for a future date, please do not be alarmed if we have not contacted you to schedule an appointment more than a month prior to the surgery date.    Please be prepared to provide the following information during this appointment:            -Personal medical history                                               -Medication and allergy list            -Any history of problems with anesthesia              -Recent lab work or diagnostic studies            -Please notify us  of any needs we should be aware of to provide the best care possible           -You will be provided with instructions on how to prepare for your surgery.    On The Day of Surgery:  You must have a driver to take you home after surgery, you will be asked not to drive for 24 hours following surgery.  Taxi, Gisele and non-medical transport will not be acceptable means of transportation unless you have a responsible individual who  will be traveling with you.  Visitors in the surgical area:   2 people will be able to visit you in your room once your preparation for surgery has been completed. During surgery, your visitors will be asked to wait in the Surgery Waiting Area.  It is not a requirement for them to stay, if they prefer to leave and come back.  Your visitor(s) will be given an update once the surgery has been completed.  No visitors are allowed in the initial recovery room to respect patient privacy and safety.  Once you are more awake and transfer to the secondary recovery area, or are transferred to an inpatient room, visitors will again be able to see you.  To  respect and protect your privacy: We will ask on the day of surgery who your driver will be and what the contact number for that individual will be. We will ask if it is okay to share information with this individual, or if there is an alternative individual that we, or the surgeon, should contact to provide updates and information. If family or friends come to the surgical information desk requesting information about you, who you have not listed with us , no information will be given.   It may be helpful to designate someone as the main contact who will be responsible for updating your other friends and family.    PREADMIT TESTING OFFICE: [phone] SAME DAY SURGERY: [phone] We look forward to caring for you before and throughout the process of your surgery.

## 2023-10-09 ENCOUNTER — Other Ambulatory Visit: Payer: Self-pay

## 2023-10-09 ENCOUNTER — Ambulatory Visit (INDEPENDENT_AMBULATORY_CARE_PROVIDER_SITE_OTHER): Admitting: Neurosurgery

## 2023-10-09 DIAGNOSIS — M960 Pseudarthrosis after fusion or arthrodesis: Secondary | ICD-10-CM

## 2023-10-09 DIAGNOSIS — M5412 Radiculopathy, cervical region: Secondary | ICD-10-CM

## 2023-10-09 DIAGNOSIS — M4802 Spinal stenosis, cervical region: Secondary | ICD-10-CM

## 2023-10-09 DIAGNOSIS — G959 Disease of spinal cord, unspecified: Secondary | ICD-10-CM

## 2023-10-09 DIAGNOSIS — Z01818 Encounter for other preprocedural examination: Secondary | ICD-10-CM

## 2023-10-09 NOTE — Progress Notes (Signed)
 Spoke to [name] regarding some questions for surgery that came up after our visit yesterday.

## 2023-10-10 ENCOUNTER — Telehealth: Payer: Self-pay

## 2023-10-10 NOTE — Telephone Encounter (Signed)
 Looks like this was addressed by Dr. Clois. Does not need cervical CT.

## 2023-10-10 NOTE — Telephone Encounter (Signed)
-----   Message from [facility] sent at 10/10/2023  9:39 AM EDT ----- Regarding: RE: CT scan Does not need CT ----- Message ----- From: Aleksa Collinsworth, RN Sent: 10/09/2023   5:27 PM EDT To: Reeves Daisy, MD Subject: CT scan                                        Does she still need the CT cervical spine (ordered by Stacy on 8/7) prior to surgery? Or not needed b/c she had the CT angio head/neck?

## 2023-10-18 ENCOUNTER — Ambulatory Visit: Admitting: Plastic Surgery

## 2023-10-18 ENCOUNTER — Encounter: Payer: Self-pay | Admitting: Plastic Surgery

## 2023-10-18 DIAGNOSIS — M542 Cervicalgia: Secondary | ICD-10-CM | POA: Diagnosis not present

## 2023-10-18 DIAGNOSIS — N62 Hypertrophy of breast: Secondary | ICD-10-CM | POA: Diagnosis not present

## 2023-10-18 DIAGNOSIS — M546 Pain in thoracic spine: Secondary | ICD-10-CM | POA: Diagnosis not present

## 2023-10-18 DIAGNOSIS — G8929 Other chronic pain: Secondary | ICD-10-CM

## 2023-10-18 NOTE — Progress Notes (Signed)
   Subjective:    Patient ID: Mary Barry, female    DOB: [birth year], 43 y.o.   MRN: [id number]  The patient is a 43 year old female joining me by phone.  She is at work here in [place]  and I am at the office.  She is 5 feet 7 inches tall and weighs around 248 pounds.  As she stated she has not been able to lose any since her last visit.  She still has bad neck and back pain.  She is hoping to get to a B size cup from a DDD cup size.  The estimated amount of tissue that will need to be removed is 800 g.  I think that that is possible.  However the patient is having neck surgery in October so we certainly would not do anything until she is completely recovered from that.      Review of Systems  Constitutional:  Positive for activity change. Negative for appetite change.  Eyes: Negative.   Respiratory: Negative.    Cardiovascular: Negative.   Gastrointestinal: Negative.   Endocrine: Negative.   Genitourinary: Negative.   Musculoskeletal:  Positive for back pain and neck pain.       Objective:   Physical Exam        Assessment & Plan:     ICD-10-CM   1. Symptomatic mammary hypertrophy  N62     2. Chronic bilateral thoracic back pain  M54.6    G89.29     3. Neck pain  M54.2       Patient will give us  a call after she has recovered from her neck surgery and then we will try again for reevaluation with insurance for bilateral breast reduction with liposuction.  I connected with  Mary Barry on 10/18/23 by phone and verified that I am speaking with the correct person. We spent 5 minutes in discussion.  I discussed the limitations of evaluation and management by telemedicine. The patient expressed understanding and agreed to proceed.

## 2023-10-26 ENCOUNTER — Other Ambulatory Visit: Payer: Self-pay | Admitting: Neurology

## 2023-10-26 DIAGNOSIS — G43009 Migraine without aura, not intractable, without status migrainosus: Secondary | ICD-10-CM

## 2023-10-26 DIAGNOSIS — M542 Cervicalgia: Secondary | ICD-10-CM

## 2023-10-26 DIAGNOSIS — G8929 Other chronic pain: Secondary | ICD-10-CM

## 2023-10-26 DIAGNOSIS — M79602 Pain in left arm: Secondary | ICD-10-CM

## 2023-11-13 ENCOUNTER — Other Ambulatory Visit: Payer: Self-pay

## 2023-11-13 ENCOUNTER — Encounter
Admission: RE | Admit: 2023-11-13 | Discharge: 2023-11-13 | Disposition: A | Discharge status: Home / Self Care | Source: Ambulatory Visit | Attending: Neurosurgery | Admitting: Neurosurgery

## 2023-11-13 VITALS — BP 137/94 | HR 66 | Temp 98.6°F | Resp 18 | Ht 67.0 in | Wt 256.7 lb

## 2023-11-13 DIAGNOSIS — Z01818 Encounter for other preprocedural examination: Secondary | ICD-10-CM | POA: Insufficient documentation

## 2023-11-13 DIAGNOSIS — I10 Essential (primary) hypertension: Secondary | ICD-10-CM

## 2023-11-13 DIAGNOSIS — E119 Type 2 diabetes mellitus without complications: Secondary | ICD-10-CM

## 2023-11-13 DIAGNOSIS — Z01812 Encounter for preprocedural laboratory examination: Secondary | ICD-10-CM

## 2023-11-13 DIAGNOSIS — Z01810 Encounter for preprocedural cardiovascular examination: Secondary | ICD-10-CM

## 2023-11-13 HISTORY — DX: Essential (primary) hypertension: I10

## 2023-11-13 HISTORY — DX: Type 2 diabetes mellitus without complications: E11.9

## 2023-11-13 HISTORY — DX: Depression, unspecified: F32.A

## 2023-11-13 HISTORY — DX: Anemia, unspecified: D64.9

## 2023-11-13 LAB — TYPE AND SCREEN
ABO/RH(D): A POS
Antibody Screen: NEGATIVE

## 2023-11-13 LAB — SURGICAL PCR SCREEN
MRSA, PCR: NEGATIVE
Staphylococcus aureus: NEGATIVE

## 2023-11-13 NOTE — Patient Instructions (Addendum)
 Your procedure is scheduled on:  [facility] OCTOBER 8  Report to the Registration Desk on the 1st floor of the [facility] To find out your arrival time, please call [phone] between 1PM - 3PM on:  TUESDAY  OCTOBER 7  If your arrival time is 6:00 am, do not arrive before that time as the [facility] entrance doors do not open until 6:00 am.  REMEMBER: Instructions that are not followed completely may result in serious medical risk, up to and including death; or upon the discretion of your surgeon and anesthesiologist your surgery may need to be rescheduled.  Do not eat food after midnight the night before surgery.  No gum chewing or hard candies.  You may however, drink WATER up to 2 hours before you are scheduled to arrive for your surgery. Do not drink anything within 2 hours of your scheduled arrival time.   One week prior to surgery: [facility] OCTOBER 1 Stop Anti-inflammatories (NSAIDS) such as Advil, Aleve, Ibuprofen, Motrin, Naproxen, Naprosyn and Aspirin based products such as Excedrin, Goody's Powder, BC Powder. Stop ANY OVER THE COUNTER supplements until after surgery. Biotin (SUPER BIOTIN )  fluticasone (FLONASE)  Magnesium Gluconate  Multiple Vitamin (MULTI-VITAMIN)  You may however, continue to take Tylenol if needed for pain up until the day of surgery.  **Follow guidelines for insulin and diabetes medications.** Metformin: hold for 2 days prior to surgery, last dose SUNDAY OCTOBER 5  Call Dr. Damian regarding your insulin pump  **Follow recommendations regarding stopping blood thinners.** Aspirin 81mg :  OK to stay on aspirin 81mg   Continue taking all of your other prescription medications up until the day of surgery.  ON THE DAY OF SURGERY ONLY TAKE THESE MEDICATIONS WITH SIPS OF WATER:  metoprolol tartrate (LOPRESSOR)  pantoprazole (PROTONIX)    No Alcohol for 24 hours before or after surgery.  Do not use any recreational drugs for at least a week  (preferably 2 weeks) before your surgery.  Please be advised that the combination of cocaine and anesthesia may have negative outcomes, up to and including death. If you test positive for cocaine, your surgery will be cancelled.  On the morning of surgery brush your teeth with toothpaste and water, you may rinse your mouth with mouthwash if you wish. Do not swallow any toothpaste or mouthwash.  Use CHG Soap as directed on instruction sheet.  Do not wear jewelry, make-up, hairpins, clips or nail polish.  For welded (permanent) jewelry: bracelets, anklets, waist bands, etc.  Please have this removed prior to surgery.  If it is not removed, there is a chance that hospital personnel will need to cut it off on the day of surgery.  Do not wear lotions, powders, or perfumes.   Do not shave body hair from the neck down 48 hours before surgery.  Do not bring valuables to the hospital. [facility] is not responsible for any missing/lost belongings or valuables.   Notify your doctor if there is any change in your medical condition (cold, fever, infection).  Wear comfortable clothing (specific to your surgery type) to the hospital.  After surgery, you can help prevent lung complications by doing breathing exercises.  Take deep breaths and cough every 1-2 hours.  If you are being admitted to the hospital overnight, leave your suitcase in the car. After surgery it may be brought to your room.  In case of increased patient census, it may be necessary for you, the patient, to continue your postoperative care in  the Same Day Surgery department.  If you are being discharged the day of surgery, you will not be allowed to drive home. You will need a responsible individual to drive you home and stay with you for 24 hours after surgery.   If you are taking public transportation, you will need to have a responsible individual with you.  Please call the Pre-admissions Testing Dept. at [phone] if  you have any questions about these instructions.  Surgery Visitation Policy:  Patients having surgery or a procedure may have two visitors.  Children under the age of 92 must have an adult with them who is not the patient.  Inpatient Visitation:    Visiting hours are 7 a.m. to 8 p.m. Up to four visitors are allowed at one time in a patient room. The visitors may rotate out with other people during the day.  One visitor age 56 or older may stay with the patient overnight and must be in the room by 8 p.m.   Merchandiser, retail to address health-related social needs:  [email or web address]    Pre-operative 5 CHG Bath Instructions   You can play a key role in reducing the risk of infection after surgery. Your skin needs to be as free of germs as possible. You can reduce the number of germs on your skin by washing with CHG (chlorhexidine gluconate) soap before surgery. CHG is an antiseptic soap that kills germs and continues to kill germs even after washing.   DO NOT use if you have an allergy to chlorhexidine/CHG or antibacterial soaps. If your skin becomes reddened or irritated, stop using the CHG and notify one of our RNs at [phone].   Please shower with the CHG soap starting 4 days before surgery using the following schedule:   STARTING SATURDAY OCTOBER 4    Please keep in mind the following:  DO NOT shave, including legs and underarms, starting the day of your first shower.   You may shave your face at any point before/day of surgery.  Place clean sheets on your bed the day you start using CHG soap. Use a clean washcloth (not used since being washed) for each shower. DO NOT sleep with pets once you start using the CHG.   CHG Shower Instructions:  If you choose to wash your hair and private area, wash first with your normal shampoo/soap.  After you use shampoo/soap, rinse your hair and body thoroughly to remove shampoo/soap residue.  Turn the water OFF  and apply about 3 tablespoons (45 ml) of CHG soap to a CLEAN washcloth.  Apply CHG soap ONLY FROM YOUR NECK DOWN TO YOUR TOES (washing for 3-5 minutes)  DO NOT use CHG soap on face, private areas, open wounds, or sores.  Pay special attention to the area where your surgery is being performed.  If you are having back surgery, having someone wash your back for you may be helpful. Wait 2 minutes after CHG soap is applied, then you may rinse off the CHG soap.  Pat dry with a clean towel  Put on clean clothes/pajamas   If you choose to wear lotion, please use ONLY the CHG-compatible lotions on the back of this paper.     Additional instructions for the day of surgery: DO NOT APPLY any lotions, deodorants, cologne, or perfumes.   Put on clean/comfortable clothes.  Brush your teeth.  Ask your nurse before applying any prescription medications to the skin.      CHG Compatible Lotions  Aveeno Moisturizing lotion  Cetaphil Moisturizing Cream  Cetaphil Moisturizing Lotion  Clairol Herbal Essence Moisturizing Lotion, Dry Skin  Clairol Herbal Essence Moisturizing Lotion, Extra Dry Skin  Clairol Herbal Essence Moisturizing Lotion, Normal Skin  Curel Age Defying Therapeutic Moisturizing Lotion with Alpha Hydroxy  Curel Extreme Care Body Lotion  Curel Soothing Hands Moisturizing Hand Lotion  Curel Therapeutic Moisturizing Cream, Fragrance-Free  Curel Therapeutic Moisturizing Lotion, Fragrance-Free  Curel Therapeutic Moisturizing Lotion, Original Formula  Eucerin Daily Replenishing Lotion  Eucerin Dry Skin Therapy Plus Alpha Hydroxy Crme  Eucerin Dry Skin Therapy Plus Alpha Hydroxy Lotion  Eucerin Original Crme  Eucerin Original Lotion  Eucerin Plus Crme Eucerin Plus Lotion  Eucerin TriLipid Replenishing Lotion  Keri Anti-Bacterial Hand Lotion  Keri Deep Conditioning Original Lotion Dry Skin Formula Softly Scented  Keri Deep Conditioning Original Lotion, Fragrance Free Sensitive Skin  Formula  Keri Lotion Fast Absorbing Fragrance Free Sensitive Skin Formula  Keri Lotion Fast Absorbing Softly Scented Dry Skin Formula  Keri Original Lotion  Keri Skin Renewal Lotion Keri Silky Smooth Lotion  Keri Silky Smooth Sensitive Skin Lotion  Nivea Body Creamy Conditioning Oil  Nivea Body Extra Enriched Teacher, adult education Moisturizing Lotion Nivea Crme  Nivea Skin Firming Lotion  NutraDerm 30 Skin Lotion  NutraDerm Skin Lotion  NutraDerm Therapeutic Skin Cream  NutraDerm Therapeutic Skin Lotion  ProShield Protective Hand Cream  Provon moisturizing lotion

## 2023-11-20 ENCOUNTER — Encounter
Admission: RE | Disposition: A | Discharge status: Home Health | Payer: Self-pay | Source: Home / Self Care | Attending: Neurosurgery

## 2023-11-20 ENCOUNTER — Encounter: Payer: Self-pay | Admitting: Neurosurgery

## 2023-11-20 ENCOUNTER — Inpatient Hospital Stay

## 2023-11-20 ENCOUNTER — Inpatient Hospital Stay
Admission: RE | Admit: 2023-11-20 | Discharge: 2023-11-23 | DRG: 029 | Disposition: A | Discharge status: Home / Self Care | Attending: Neurosurgery | Admitting: Neurosurgery

## 2023-11-20 ENCOUNTER — Telehealth: Payer: Self-pay | Admitting: Neurosurgery

## 2023-11-20 ENCOUNTER — Other Ambulatory Visit: Payer: Self-pay

## 2023-11-20 ENCOUNTER — Inpatient Hospital Stay: Payer: Self-pay | Admitting: Urgent Care

## 2023-11-20 DIAGNOSIS — M4802 Spinal stenosis, cervical region: Secondary | ICD-10-CM | POA: Diagnosis present

## 2023-11-20 DIAGNOSIS — Z01812 Encounter for preprocedural laboratory examination: Secondary | ICD-10-CM

## 2023-11-20 DIAGNOSIS — M5412 Radiculopathy, cervical region: Principal | ICD-10-CM | POA: Diagnosis present

## 2023-11-20 DIAGNOSIS — I10 Essential (primary) hypertension: Secondary | ICD-10-CM | POA: Diagnosis present

## 2023-11-20 DIAGNOSIS — E66813 Obesity, class 3: Secondary | ICD-10-CM | POA: Diagnosis present

## 2023-11-20 DIAGNOSIS — G992 Myelopathy in diseases classified elsewhere: Secondary | ICD-10-CM | POA: Diagnosis present

## 2023-11-20 DIAGNOSIS — Z6841 Body Mass Index (BMI) 40.0 and over, adult: Secondary | ICD-10-CM | POA: Diagnosis not present

## 2023-11-20 DIAGNOSIS — Y838 Other surgical procedures as the cause of abnormal reaction of the patient, or of later complication, without mention of misadventure at the time of the procedure: Secondary | ICD-10-CM | POA: Diagnosis present

## 2023-11-20 DIAGNOSIS — Z87891 Personal history of nicotine dependence: Secondary | ICD-10-CM | POA: Diagnosis not present

## 2023-11-20 DIAGNOSIS — G959 Disease of spinal cord, unspecified: Secondary | ICD-10-CM

## 2023-11-20 DIAGNOSIS — E109 Type 1 diabetes mellitus without complications: Secondary | ICD-10-CM | POA: Diagnosis present

## 2023-11-20 DIAGNOSIS — Z23 Encounter for immunization: Secondary | ICD-10-CM | POA: Diagnosis not present

## 2023-11-20 DIAGNOSIS — M960 Pseudarthrosis after fusion or arthrodesis: Secondary | ICD-10-CM | POA: Diagnosis present

## 2023-11-20 DIAGNOSIS — E785 Hyperlipidemia, unspecified: Secondary | ICD-10-CM | POA: Diagnosis present

## 2023-11-20 DIAGNOSIS — Z981 Arthrodesis status: Principal | ICD-10-CM

## 2023-11-20 DIAGNOSIS — Z91040 Latex allergy status: Secondary | ICD-10-CM

## 2023-11-20 DIAGNOSIS — Z01818 Encounter for other preprocedural examination: Secondary | ICD-10-CM

## 2023-11-20 DIAGNOSIS — E119 Type 2 diabetes mellitus without complications: Secondary | ICD-10-CM

## 2023-11-20 HISTORY — PX: POSTERIOR CERVICAL FUSION/FORAMINOTOMY: SHX5038

## 2023-11-20 LAB — GLUCOSE, CAPILLARY
Glucose-Capillary: 108 mg/dL — ABNORMAL HIGH (ref 70–99)
Glucose-Capillary: 140 mg/dL — ABNORMAL HIGH (ref 70–99)
Glucose-Capillary: 145 mg/dL — ABNORMAL HIGH (ref 70–99)
Glucose-Capillary: 228 mg/dL — ABNORMAL HIGH (ref 70–99)
Glucose-Capillary: 280 mg/dL — ABNORMAL HIGH (ref 70–99)

## 2023-11-20 LAB — HEMOGLOBIN A1C
Hgb A1c MFr Bld: 7.1 % — ABNORMAL HIGH (ref 4.8–5.6)
Mean Plasma Glucose: 157.07 mg/dL

## 2023-11-20 LAB — CREATININE, SERUM
Creatinine, Ser: 0.54 mg/dL (ref 0.44–1.00)
GFR, Estimated: >60 mL/min (ref >60)

## 2023-11-20 LAB — ABO/RH: ABO/RH(D): A POS

## 2023-11-20 LAB — POCT PREGNANCY, URINE: Preg Test, Ur: NEGATIVE

## 2023-11-20 SURGERY — POSTERIOR CERVICAL FUSION/FORAMINOTOMY LEVEL 2
Anesthesia: General

## 2023-11-20 MED ORDER — FENTANYL CITRATE (PF) 100 MCG/2ML IJ SOLN
INTRAMUSCULAR | Status: DC | PRN
  Administered 2023-11-20 (×2): 50 ug via INTRAVENOUS

## 2023-11-20 MED ORDER — KETOROLAC TROMETHAMINE 15 MG/ML IJ SOLN
15.0000 mg | Freq: Four times a day (QID) | INTRAMUSCULAR | Status: AC
  Administered 2023-11-20 – 2023-11-21 (×4): 15 mg via INTRAVENOUS
  Filled 2023-11-20 (×3): qty 1

## 2023-11-20 MED ORDER — OXYCODONE HCL 5 MG/5ML PO SOLN: 5.0000 mg | Freq: Once | ORAL | Status: AC | PRN

## 2023-11-20 MED ORDER — SODIUM CHLORIDE 0.9 % IV SOLN: INTRAVENOUS | Status: DC

## 2023-11-20 MED ORDER — 0.9 % SODIUM CHLORIDE (POUR BTL) OPTIME
TOPICAL | Status: DC | PRN
  Administered 2023-11-20: 420 mL

## 2023-11-20 MED ORDER — NORTRIPTYLINE HCL 10 MG PO CAPS
20.0000 mg | ORAL_CAPSULE | Freq: Every day | ORAL | Status: DC
  Administered 2023-11-20 – 2023-11-22 (×3): 20 mg via ORAL
  Filled 2023-11-20 (×3): qty 2

## 2023-11-20 MED ORDER — SORBITOL 70 % SOLN: 30.0000 mL | Freq: Every day | Status: DC | PRN

## 2023-11-20 MED ORDER — ONDANSETRON HCL 4 MG PO TABS: 4.0000 mg | ORAL_TABLET | Freq: Four times a day (QID) | ORAL | Status: DC | PRN

## 2023-11-20 MED ORDER — CEFAZOLIN SODIUM-DEXTROSE 2-4 GM/100ML-% IV SOLN
INTRAVENOUS | Status: AC
  Filled 2023-11-20: qty 100

## 2023-11-20 MED ORDER — INSULIN PUMP
SUBCUTANEOUS | Status: DC
  Administered 2023-11-21 (×2): 14 via SUBCUTANEOUS
  Filled 2023-11-20: qty 1

## 2023-11-20 MED ORDER — REMIFENTANIL HCL 1 MG IV SOLR
INTRAVENOUS | Status: DC | PRN
  Administered 2023-11-20: .05 ug/kg/min via INTRAVENOUS

## 2023-11-20 MED ORDER — OXYCODONE HCL 5 MG PO TABS
5.0000 mg | ORAL_TABLET | Freq: Once | ORAL | Status: AC | PRN
  Administered 2023-11-20: 5 mg via ORAL

## 2023-11-20 MED ORDER — BUPIVACAINE-EPINEPHRINE (PF) 0.5% -1:200000 IJ SOLN
INTRAMUSCULAR | Status: AC
  Filled 2023-11-20: qty 10

## 2023-11-20 MED ORDER — LIDOCAINE HCL (CARDIAC) PF 100 MG/5ML IV SOSY
PREFILLED_SYRINGE | INTRAVENOUS | Status: DC | PRN
  Administered 2023-11-20: 100 mg via INTRAVENOUS

## 2023-11-20 MED ORDER — CEFAZOLIN SODIUM-DEXTROSE 2-4 GM/100ML-% IV SOLN
2.0000 g | INTRAVENOUS | Status: AC
  Administered 2023-11-20: 2 g via INTRAVENOUS

## 2023-11-20 MED ORDER — MENTHOL 3 MG MT LOZG: 1.0000 | LOZENGE | OROMUCOSAL | Status: DC | PRN

## 2023-11-20 MED ORDER — FENTANYL CITRATE (PF) 100 MCG/2ML IJ SOLN
INTRAMUSCULAR | Status: AC
  Filled 2023-11-20: qty 2

## 2023-11-20 MED ORDER — FENTANYL CITRATE (PF) 100 MCG/2ML IJ SOLN
25.0000 ug | INTRAMUSCULAR | Status: DC | PRN
  Administered 2023-11-20 (×3): 50 ug via INTRAVENOUS

## 2023-11-20 MED ORDER — MIDAZOLAM HCL 2 MG/2ML IJ SOLN
INTRAMUSCULAR | Status: DC | PRN
  Administered 2023-11-20: 2 mg via INTRAVENOUS

## 2023-11-20 MED ORDER — PROPOFOL 10 MG/ML IV BOLUS
INTRAVENOUS | Status: DC | PRN
  Administered 2023-11-20: 150 mg via INTRAVENOUS
  Administered 2023-11-20: 140 ug/kg/min via INTRAVENOUS

## 2023-11-20 MED ORDER — ACETAMINOPHEN 500 MG PO TABS
1000.0000 mg | ORAL_TABLET | Freq: Four times a day (QID) | ORAL | Status: DC
  Administered 2023-11-20 – 2023-11-22 (×9): 1000 mg via ORAL
  Filled 2023-11-20 (×12): qty 2

## 2023-11-20 MED ORDER — PHENYLEPHRINE HCL-NACL 20-0.9 MG/250ML-% IV SOLN
INTRAVENOUS | Status: AC
  Filled 2023-11-20: qty 250

## 2023-11-20 MED ORDER — INSULIN ASPART 100 UNIT/ML IJ SOLN: 0.0000 [IU] | Freq: Every day | INTRAMUSCULAR | Status: DC

## 2023-11-20 MED ORDER — CHLORHEXIDINE GLUCONATE 0.12 % MT SOLN
OROMUCOSAL | Status: AC
  Filled 2023-11-20: qty 15

## 2023-11-20 MED ORDER — SODIUM CHLORIDE (PF) 0.9 % IJ SOLN
INTRAMUSCULAR | Status: DC | PRN
  Administered 2023-11-20: 60 mL

## 2023-11-20 MED ORDER — ONDANSETRON HCL 4 MG/2ML IJ SOLN
INTRAMUSCULAR | Status: AC
Start: 2023-11-20 — End: 2023-11-20
  Filled 2023-11-20: qty 2

## 2023-11-20 MED ORDER — INFLUENZA VIRUS VACC SPLIT PF (FLUZONE) 0.5 ML IM SUSY
0.5000 mL | PREFILLED_SYRINGE | INTRAMUSCULAR | Status: AC
  Administered 2023-11-21: 0.5 mL via INTRAMUSCULAR
  Filled 2023-11-20: qty 0.5

## 2023-11-20 MED ORDER — PHENYLEPHRINE HCL-NACL 20-0.9 MG/250ML-% IV SOLN
INTRAVENOUS | Status: DC | PRN
  Administered 2023-11-20: 40 ug/min via INTRAVENOUS

## 2023-11-20 MED ORDER — REMIFENTANIL HCL 1 MG IV SOLR
INTRAVENOUS | Status: AC
  Filled 2023-11-20: qty 1000

## 2023-11-20 MED ORDER — MIDAZOLAM HCL 2 MG/2ML IJ SOLN
INTRAMUSCULAR | Status: AC
  Filled 2023-11-20: qty 2

## 2023-11-20 MED ORDER — DEXAMETHASONE SODIUM PHOSPHATE 10 MG/ML IJ SOLN
INTRAMUSCULAR | Status: AC
  Filled 2023-11-20: qty 1

## 2023-11-20 MED ORDER — PHENOL 1.4 % MT LIQD: 1.0000 | OROMUCOSAL | Status: DC | PRN

## 2023-11-20 MED ORDER — PHENYLEPHRINE 80 MCG/ML (10ML) SYRINGE FOR IV PUSH (FOR BLOOD PRESSURE SUPPORT)
PREFILLED_SYRINGE | INTRAVENOUS | Status: AC
  Filled 2023-11-20: qty 10

## 2023-11-20 MED ORDER — DOCUSATE SODIUM 100 MG PO CAPS
100.0000 mg | ORAL_CAPSULE | Freq: Two times a day (BID) | ORAL | Status: DC
  Administered 2023-11-20 – 2023-11-23 (×7): 100 mg via ORAL
  Filled 2023-11-20 (×7): qty 1

## 2023-11-20 MED ORDER — POLYETHYLENE GLYCOL 3350 17 G PO PACK: 17.0000 g | PACK | Freq: Every day | ORAL | Status: DC | PRN

## 2023-11-20 MED ORDER — ACETAMINOPHEN 650 MG RE SUPP: 650.0000 mg | RECTAL | Status: DC | PRN

## 2023-11-20 MED ORDER — GABAPENTIN 300 MG PO CAPS
300.0000 mg | ORAL_CAPSULE | Freq: Three times a day (TID) | ORAL | Status: DC
  Administered 2023-11-20 – 2023-11-23 (×9): 300 mg via ORAL
  Filled 2023-11-20 (×9): qty 1

## 2023-11-20 MED ORDER — SUCRALFATE 1 G PO TABS
1.0000 g | ORAL_TABLET | Freq: Every day | ORAL | Status: DC
  Administered 2023-11-20 – 2023-11-23 (×4): 1 g via ORAL
  Filled 2023-11-20 (×4): qty 1

## 2023-11-20 MED ORDER — SUCCINYLCHOLINE CHLORIDE 200 MG/10ML IV SOSY
PREFILLED_SYRINGE | INTRAVENOUS | Status: DC | PRN
  Administered 2023-11-20: 100 mg via INTRAVENOUS

## 2023-11-20 MED ORDER — PROPOFOL 1000 MG/100ML IV EMUL
INTRAVENOUS | Status: AC
Start: 2023-11-20 — End: 2023-11-20
  Filled 2023-11-20: qty 100

## 2023-11-20 MED ORDER — PROPOFOL 10 MG/ML IV BOLUS
INTRAVENOUS | Status: AC
  Filled 2023-11-20: qty 20

## 2023-11-20 MED ORDER — ONDANSETRON HCL 4 MG/2ML IJ SOLN
4.0000 mg | Freq: Four times a day (QID) | INTRAMUSCULAR | Status: DC | PRN
  Administered 2023-11-20: 4 mg via INTRAVENOUS
  Filled 2023-11-20: qty 2

## 2023-11-20 MED ORDER — OXYCODONE HCL 5 MG PO TABS
ORAL_TABLET | ORAL | Status: AC
  Filled 2023-11-20: qty 1

## 2023-11-20 MED ORDER — DIAZEPAM 5 MG PO TABS: 5.0000 mg | ORAL_TABLET | Freq: Four times a day (QID) | ORAL | Status: AC | PRN

## 2023-11-20 MED ORDER — PHENYLEPHRINE 80 MCG/ML (10ML) SYRINGE FOR IV PUSH (FOR BLOOD PRESSURE SUPPORT)
PREFILLED_SYRINGE | INTRAVENOUS | Status: DC | PRN
  Administered 2023-11-20: 240 ug via INTRAVENOUS
  Administered 2023-11-20: 160 ug via INTRAVENOUS
  Administered 2023-11-20: 240 ug via INTRAVENOUS
  Administered 2023-11-20: 160 ug via INTRAVENOUS

## 2023-11-20 MED ORDER — SODIUM CHLORIDE 0.9 % IV SOLN
250.0000 mL | INTRAVENOUS | Status: AC
  Administered 2023-11-20: 250 mL via INTRAVENOUS

## 2023-11-20 MED ORDER — BUPIVACAINE HCL (PF) 0.5 % IJ SOLN
INTRAMUSCULAR | Status: AC
  Filled 2023-11-20: qty 30

## 2023-11-20 MED ORDER — LIDOCAINE HCL (PF) 2 % IJ SOLN
INTRAMUSCULAR | Status: AC
Start: 2023-11-20 — End: 2023-11-20
  Filled 2023-11-20: qty 5

## 2023-11-20 MED ORDER — METFORMIN HCL ER 500 MG PO TB24
1000.0000 mg | ORAL_TABLET | Freq: Every day | ORAL | Status: DC
  Administered 2023-11-21 – 2023-11-23 (×3): 1000 mg via ORAL
  Filled 2023-11-20 (×3): qty 2

## 2023-11-20 MED ORDER — VANCOMYCIN HCL 1000 MG IV SOLR
INTRAVENOUS | Status: DC | PRN
  Administered 2023-11-20: 1000 mg via TOPICAL

## 2023-11-20 MED ORDER — MAGNESIUM CITRATE PO SOLN: 1.0000 | Freq: Once | ORAL | Status: DC | PRN

## 2023-11-20 MED ORDER — ORAL CARE MOUTH RINSE: 15.0000 mL | Freq: Once | OROMUCOSAL | Status: AC

## 2023-11-20 MED ORDER — ACETAMINOPHEN 325 MG PO TABS: 650.0000 mg | ORAL_TABLET | ORAL | Status: DC | PRN

## 2023-11-20 MED ORDER — BUPIVACAINE LIPOSOME 1.3 % IJ SUSP
INTRAMUSCULAR | Status: AC
Start: 2023-11-20 — End: 2023-11-20
  Filled 2023-11-20: qty 20

## 2023-11-20 MED ORDER — ONDANSETRON HCL 4 MG/2ML IJ SOLN
INTRAMUSCULAR | Status: DC | PRN
Start: 2023-11-20 — End: 2023-11-20
  Administered 2023-11-20: 4 mg via INTRAVENOUS

## 2023-11-20 MED ORDER — CEFAZOLIN IN SODIUM CHLORIDE 2-0.9 GM/100ML-% IV SOLN
2.0000 g | Freq: Once | INTRAVENOUS | Status: DC
  Filled 2023-11-20: qty 100

## 2023-11-20 MED ORDER — SODIUM CHLORIDE 0.9% FLUSH: 3.0000 mL | INTRAVENOUS | Status: DC | PRN

## 2023-11-20 MED ORDER — OXYCODONE HCL 5 MG PO TABS
5.0000 mg | ORAL_TABLET | ORAL | Status: DC | PRN
  Administered 2023-11-20: 5 mg via ORAL
  Filled 2023-11-20: qty 1

## 2023-11-20 MED ORDER — BUPIVACAINE-EPINEPHRINE (PF) 0.5% -1:200000 IJ SOLN
INTRAMUSCULAR | Status: DC | PRN
  Administered 2023-11-20: 10 mL

## 2023-11-20 MED ORDER — PROPOFOL 1000 MG/100ML IV EMUL
INTRAVENOUS | Status: AC
  Filled 2023-11-20: qty 100

## 2023-11-20 MED ORDER — CYCLOBENZAPRINE HCL 10 MG PO TABS
10.0000 mg | ORAL_TABLET | Freq: Three times a day (TID) | ORAL | Status: DC | PRN
  Administered 2023-11-20 – 2023-11-22 (×4): 10 mg via ORAL
  Filled 2023-11-20 (×6): qty 1

## 2023-11-20 MED ORDER — METOPROLOL TARTRATE 25 MG PO TABS
25.0000 mg | ORAL_TABLET | Freq: Two times a day (BID) | ORAL | Status: DC
  Administered 2023-11-20 – 2023-11-23 (×4): 25 mg via ORAL
  Filled 2023-11-20 (×5): qty 1

## 2023-11-20 MED ORDER — SODIUM CHLORIDE 0.9% FLUSH
3.0000 mL | Freq: Two times a day (BID) | INTRAVENOUS | Status: DC
  Administered 2023-11-20 – 2023-11-23 (×7): 3 mL via INTRAVENOUS

## 2023-11-20 MED ORDER — KETOROLAC TROMETHAMINE 15 MG/ML IJ SOLN
INTRAMUSCULAR | Status: AC
Start: 2023-11-20 — End: 2023-11-20
  Filled 2023-11-20: qty 1

## 2023-11-20 MED ORDER — SURGIFLO WITH THROMBIN (HEMOSTATIC MATRIX KIT) OPTIME
TOPICAL | Status: DC | PRN
Start: 2023-11-20 — End: 2023-11-20
  Administered 2023-11-20: 1 via TOPICAL

## 2023-11-20 MED ORDER — OXYCODONE HCL 5 MG PO TABS
10.0000 mg | ORAL_TABLET | ORAL | Status: DC | PRN
  Administered 2023-11-20 – 2023-11-23 (×12): 10 mg via ORAL
  Filled 2023-11-20 (×12): qty 2

## 2023-11-20 MED ORDER — PANTOPRAZOLE SODIUM 40 MG PO TBEC
40.0000 mg | DELAYED_RELEASE_TABLET | Freq: Every day | ORAL | Status: DC
  Administered 2023-11-21 – 2023-11-23 (×3): 40 mg via ORAL
  Filled 2023-11-20 (×3): qty 1

## 2023-11-20 MED ORDER — SODIUM CHLORIDE (PF) 0.9 % IJ SOLN
INTRAMUSCULAR | Status: AC
  Filled 2023-11-20: qty 20

## 2023-11-20 MED ORDER — ENOXAPARIN SODIUM 40 MG/0.4ML IJ SOSY
40.0000 mg | PREFILLED_SYRINGE | INTRAMUSCULAR | Status: DC
  Administered 2023-11-21 – 2023-11-23 (×3): 40 mg via SUBCUTANEOUS
  Filled 2023-11-20 (×3): qty 0.4

## 2023-11-20 MED ORDER — DEXAMETHASONE SODIUM PHOSPHATE 10 MG/ML IJ SOLN
INTRAMUSCULAR | Status: DC | PRN
  Administered 2023-11-20: 5 mg via INTRAVENOUS

## 2023-11-20 MED ORDER — SUCCINYLCHOLINE CHLORIDE 200 MG/10ML IV SOSY
PREFILLED_SYRINGE | INTRAVENOUS | Status: AC
  Filled 2023-11-20: qty 10

## 2023-11-20 MED ORDER — INSULIN ASPART 100 UNIT/ML IJ SOLN: 0.0000 [IU] | Freq: Three times a day (TID) | INTRAMUSCULAR | Status: DC

## 2023-11-20 MED ORDER — CHLORHEXIDINE GLUCONATE 0.12 % MT SOLN
15.0000 mL | Freq: Once | OROMUCOSAL | Status: AC
  Administered 2023-11-20: 15 mL via OROMUCOSAL

## 2023-11-20 MED ORDER — SENNA 8.6 MG PO TABS
1.0000 | ORAL_TABLET | Freq: Two times a day (BID) | ORAL | Status: DC
  Administered 2023-11-20 – 2023-11-23 (×7): 8.6 mg via ORAL
  Filled 2023-11-20 (×7): qty 1

## 2023-11-20 MED ORDER — VANCOMYCIN HCL 1000 MG IV SOLR
INTRAVENOUS | Status: AC
Start: 2023-11-20 — End: 2023-11-20
  Filled 2023-11-20: qty 40

## 2023-11-20 SURGICAL SUPPLY — 42 items
ALLOGRAFT BONE FIBER KORE 10CC (Bone Implant) IMPLANT
BASIN KIT SINGLE STR (MISCELLANEOUS) ×1 IMPLANT
BUR NEURO DRILL SOFT 3.0X3.8M (BURR) ×1 IMPLANT
DERMABOND ADVANCED .7 DNX12 (GAUZE/BANDAGES/DRESSINGS) IMPLANT
DRAPE HD 5FT BACK TABLE (DRAPES) ×1 IMPLANT
DRAPE LAPAROTOMY 100X77 ABD (DRAPES) ×1 IMPLANT
DRAPE SPINE LEICA/WILD 54X150 (DRAPES) IMPLANT
DRAPE TABLE BACK 80X90 (DRAPES) ×1 IMPLANT
ELECTRODE REM PT RTRN 9FT ADLT (ELECTROSURGICAL) ×1 IMPLANT
EVACUATOR 1/8 PVC DRAIN (DRAIN) IMPLANT
FEE INTRAOP CADWELL SUPPLY NCS (MISCELLANEOUS) IMPLANT
FEE INTRAOP MONITOR IMPULS NCS (MISCELLANEOUS) IMPLANT
GLOVE BIOGEL PI IND STRL 6.5 (GLOVE) ×1 IMPLANT
GLOVE SURG SYN 6.5 PF PI (GLOVE) ×1 IMPLANT
GLOVE SURG SYN 8.5 PF PI (GLOVE) ×3 IMPLANT
GOWN SRG LRG LVL 4 IMPRV REINF (GOWNS) ×1 IMPLANT
GOWN SRG XL LVL 3 NONREINFORCE (GOWNS) ×1 IMPLANT
HOLDER FOLEY CATH W/STRAP (MISCELLANEOUS) IMPLANT
KIT PREVENA INCISION MGT 13 (CANNISTER) IMPLANT
KIT SPINAL PRONEVIEW (KITS) ×1 IMPLANT
LAVAGE JET IRRISEPT WOUND (IRRIGATION / IRRIGATOR) IMPLANT
MANIFOLD NEPTUNE II (INSTRUMENTS) ×1 IMPLANT
MARKER SKIN DUAL TIP RULER LAB (MISCELLANEOUS) IMPLANT
NDL SAFETY ECLIPSE 18X1.5 (NEEDLE) ×1 IMPLANT
PACK LAMINECTOMY ARMC (PACKS) ×1 IMPLANT
PAD ARMBOARD POSITIONER FOAM (MISCELLANEOUS) ×1 IMPLANT
ROD RELINE C 3.5X40 (Rod) IMPLANT
ROD RELINE C PB 3.5 X50 (Screw) IMPLANT
SCREW LOCK RELINE C OPEN (Screw) IMPLANT
SCREW SP MA RELINE-C 3.5X14 (Screw) IMPLANT
SOLN 0.9% NACL 500 ML (IV SOLUTION) ×1 IMPLANT
SOLN 0.9% NACL POUR BTL 500 ML (IV SOLUTION) ×1 IMPLANT
STAPLER SKIN PROX 35W (STAPLE) ×1 IMPLANT
SURGIFLO W/THROMBIN 8M KIT (HEMOSTASIS) ×1 IMPLANT
SUT STRATA 3-0 15 PS-2 (SUTURE) ×1 IMPLANT
SUT VIC AB 0 CT1 27XCR 8 STRN (SUTURE) ×2 IMPLANT
SUT VIC AB 2-0 CT1 18 (SUTURE) ×2 IMPLANT
SUTURE EHLN 3-0 FS-10 30 BLK (SUTURE) IMPLANT
SYR 30ML LL (SYRINGE) ×2 IMPLANT
TAPE CLOTH 3X10 WHT NS LF (GAUZE/BANDAGES/DRESSINGS) ×2 IMPLANT
TRAP FLUID SMOKE EVACUATOR (MISCELLANEOUS) ×1 IMPLANT
TRAY FOLEY SLVR 16FR LF STAT (SET/KITS/TRAYS/PACK) IMPLANT

## 2023-11-20 NOTE — Anesthesia Postprocedure Evaluation (Signed)
 Anesthesia Post Note  Patient: Mary Barry  Procedure(s) Performed: POSTERIOR CERVICAL FUSION/FORAMINOTOMY LEVEL 2  Patient location during evaluation: PACU Anesthesia Type: General Level of consciousness: awake and alert Pain management: pain level controlled Vital Signs Assessment: post-procedure vital signs reviewed and stable Respiratory status: spontaneous breathing, nonlabored ventilation, respiratory function stable and patient connected to nasal cannula oxygen Cardiovascular status: blood pressure returned to baseline and stable Postop Assessment: no apparent nausea or vomiting Anesthetic complications: no   No notable events documented.   Last Vitals:  Vitals:   11/20/23 1145 11/20/23 1200  BP: 125/84 127/74  Pulse: 70 65  Resp: 18   Temp: (!) 36 C (!) 36.4 C  SpO2: 98% 100%    Last Pain:  Vitals:   11/20/23 1209  TempSrc:   PainSc: 8                  Debby Mines

## 2023-11-20 NOTE — Transfer of Care (Signed)
 Immediate Anesthesia Transfer of Care Note  Patient: Mary Barry  Procedure(s) Performed: POSTERIOR CERVICAL FUSION/FORAMINOTOMY LEVEL 2  Patient Location: PACU  Anesthesia Type:General  Level of Consciousness: sedated  Airway & Oxygen Therapy: Patient Spontanous Breathing and Patient connected to face mask oxygen  Post-op Assessment: Report given to RN  Post vital signs: Reviewed and stable  Last Vitals:  Vitals Value Taken Time  BP 119/83 11/20/23 10:15  Temp    Pulse 71 11/20/23 10:17  Resp 18 11/20/23 10:17  SpO2 99 % 11/20/23 10:17  Vitals shown include unfiled device data.  Last Pain:  Vitals:   11/20/23 0622  TempSrc: Oral  PainSc: 0-No pain         Complications: No notable events documented.

## 2023-11-20 NOTE — Op Note (Signed)
 Indications: [name] is a 43 y.o. 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   Preoperative Note:   Risks of surgery discussed include: infection, bleeding, stroke, coma, death, paralysis, CSF leak, nerve/spinal cord injury, numbness, tingling, weakness, complex regional pain syndrome, recurrent stenosis and/or disc herniation, vascular injury, development of instability, neck/back pain, need for further surgery, persistent symptoms, development of deformity, and the risks of anesthesia. The patient understood these risks and agreed to proceed.  Operative Note:   OPERATIVE PROCEDURE:  1. Posterior Segmental Instrumentation C\5-6 using Nuvasive Reline C 2. Posterolateral arthrodesis from C5-7 3. Cervical Laminectomy from C6-7 for decompression of the spinal cord 4. Harvesting of autograft via the same incision 5. Use of flouroscopy 6.  Right C5-6 laminoforaminotomy and bilateral C6-7 foraminotomies   OPERATIVE PROCEDURE:  After induction of general anesthesia, the patient was placed in the prone position on the operative table.  A midline incision was then planned using fluoroscopy.  A timeout was performed, and antibiotics given.  Next, the posterior cervical region was prepped and draped in the usual sterile fashion. The incision was injected with local anesthetic, then opened sharply. A subperiosteal dissection was then  carried out to expose the posterior elements from C5 to C7 with careful attention paid to maintaining the C4/5 facet capsule.  After satisfactory exposure had been obtained, our attention was turned to placement of lateral mass screws.  On each side, the high speed drill was used to remove the soft tissue of the facet from C5/6 to C6/7. Lateral mass screws were then placed at each level using a modified Magerl technique. Briefly, a pilot hole was drilled in the lateral mass using the high-speed drill on each side.  Next, a drill was used to drill a tract in each lateral mass to 14mm. A balltip probe was used to confirm lack of breach. We then placed 3.5x 14 mm screws at C5 to C7 inclusive.  Rods were measured and shaped, then secured to the screws according to manufacturer's specifications.  After placement of the lateral mass screws, the high speed drill was used to drill trough laminectomies from the superior aspect of C7 through the entirety of C6.  The C6 spinous process was then removed.  High-speed drill was used to drill a laminotomy on the right side at C5.  The superior half of the C7 lamina was thinned.  Using the 2 mm Kerrison punch, the ligamentum flavum was removed and the remaining bone was removed including the superior half of the C7 lamina.  We then moved to foraminotomies.  Using high-speed drill, the medial half of the C6-7 facet was removed bilaterally.  Using the 1 and 2 mm punches, the foramen was open until the bilateral C7 nerve roots were decompressed.  We then similarly performed right C5-6 foraminotomies.  No CSF leak was identified.     After decompression was complete, final AP and lateral radiographs were taken.  The wound was copiously irrigated and hemostasis was achieved.  Using high-speed drill, the lateral margin of the lateral masses was  gently decorticated.  The bone harvested from the laminectomy was processed and placed posterolaterally and in the facet joints to aid in  arthrodesis.  A Hemovac drain was then placed in the wound deep to the fascia.   The wound was closed in a multilayer fashion using interrupted 0 and 2-0 Vicryl sutures.  The final skin edges were reapproximated using a 3-0 monocryl.  A dressing was placed.  After closure, the patient was flipped supine. Patient was then handed back over to anesthesia.  All counts were correct at the conclusion of the procedure.  Neurological monitoring was used throughout, and there were no changes.  Mary Goods PA acted as an Designer, television/film set throughout the case. An assistant was required for this procedure due to the complexity.  The assistant provided assistance in tissue manipulation and suction, and was required for the successful and safe performance of the procedure. I performed the critical portions of the procedure.   Mary Daisy MD

## 2023-11-20 NOTE — Progress Notes (Signed)
 1430 Insulin pump placed to back of pt right arm. Consent signed and placed in chart. Flowsheet provided for pt at bedside.

## 2023-11-20 NOTE — H&P (Signed)
 Referring Physician:  Clois Fret, MD [address] [place],  [place] [address]  Primary Physician:  Cyrus Mayo [facility], [place]  History of Present Illness: 11/20/2023 Mary Barry presents for surgical intervention.   10/08/2023 Mary Barry is here today with a chief complaint of several years of neck pain.  She is also having issues with imbalance as well as numbness and discomfort into her arms.  She underwent an anterior cervical discectomy and fusion in 2020.  She has had worsening neck pain since that time.  10/04/2023 Note from [place], [place] -C Mary Barry is a 43 y.o. female has a history of DM, diabetic gastroparesis, cardiac hypertrophy, GERD, HTN, hyperlipidemia, gastric ulcers.    History of ACDF in 2020 and she did well.    Last seen by me on 09/19/23 for constant left sided neck pain with left arm pain into her fingers. Xrays showed possible lucency at C7 screw. She also has known moderate left foraminal stenosis C6-C7 as well.    She was sent to neurology for possible dissection. CTA of head and neck looked good. She saw Dr. Leigh on 10/02/23 and he discussed starting amitriptyline for headaches.    She continues with constant left sided neck pain with left arm into her fingers that is a little better but still constant. No right arm symptoms. She has numbness, tingling, and weakness in her left arm. She continues with intermittent headaches/pain. Still seeing black spots intermittently. She notes some swallowing issues as well that recently started when neck pain did. She did not have swallowing issues after her surgery.    She is diabetic. Last HgbA1c was 7.2 on 06/25/23. She was discharged from PT on 07/10/23.    Tobacco use:  Does not smoke.      Conservative measures:  Physical therapy: was discharged from PT on 07/10/23 for her neck and back.  Multimodal medical therapy including regular antiinflammatories:  Flexeril, Gabapentin Injections: no epidural steroid injections   Past Surgery: History of ACDF in 2020.  Mary Barry has symptoms of cervical myelopathy.  The symptoms are causing a significant impact on the patient's life.   I have utilized the care everywhere function in epic to review the outside records available from external health systems.   Review of Systems:  A 10 point review of systems is negative, except for the pertinent positives and negatives detailed in the HPI.  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   Depression    Diabetes mellitus without complication (HCC)    Hypertension    Neck pain    Neck pain 04/10/2023   Symptomatic mammary hypertrophy 04/10/2023    Past Surgical History: Past Surgical History:  Procedure Laterality Date   ABLATION     BREAST MASS EXCISION Left 1993   cervical fusin     CESAREAN SECTION     DILATION AND CURETTAGE OF UTERUS      Allergies: Allergies as of 10/09/2023   (No Known Allergies)    Medications:  Current Facility-Administered Medications:    0.9 %  sodium chloride infusion, , Intravenous, Continuous, Dario Barter, MD   ceFAZolin (ANCEF) IVPB 2g/100 mL premix, 2 g, Intravenous, 60 min Pre-Op, Barry Fret, MD  Social History: Social History   Tobacco Use   Smoking  status: Former    Types: Cigarettes   Smokeless tobacco: Never  Vaping Use   Vaping status: Never Used  Substance Use Topics   Alcohol use: Not Currently   Drug use: Never    Family Medical History: History reviewed. No pertinent family history.  Physical Examination: Vitals:   11/20/23 0622  BP: 124/69  Pulse: 73  Resp: 18  Temp: 98.1 F (36.7 C)  SpO2: 100%   Heart sounds normal no MRG. Chest Clear to Auscultation Bilaterally.  General: Patient is in no apparent  distress. Attention to examination is appropriate.  Neck:   Supple.  Full range of motion.  Respiratory: Patient is breathing without any difficulty.   NEUROLOGICAL:     Awake, alert, oriented to person, place, and time.  Speech is clear and fluent.   Cranial Nerves: Pupils equal round and reactive to light.  Facial tone is symmetric.  Facial sensation is symmetric. Shoulder shrug is symmetric. Tongue protrusion is midline.  There is no pronator drift.  Strength: Side Biceps Triceps Deltoid Interossei Grip Wrist Ext. Wrist Flex.  R 5 5 5 5 5 5 5   L 4 4 5 5  4+ 5 5   Side Iliopsoas Quads Hamstring PF DF EHL  R 5 5 5 5 5 5   L 5 5 5 5 5 5    Reflexes are 1+ and symmetric at the biceps, triceps, brachioradialis, patella and achilles.   Hoffman's is present.   Bilateral upper and lower extremity sensation is intact to light touch.    No evidence of dysmetria noted.  Medical Decision Making  Imaging: MRI C spine 09/17/2023 IMPRESSION: 1. Prior C5-C7 ACDF. 2. Cervical spondylosis as outlined within the body of the report. At C7-T1, a previously demonstrated right center disc protrusion is no longer present. Findings have otherwise not significantly changed from the MRI of 10/11/2021. 3. At C6-C7, there is multifactorial mild-to-moderate spinal canal stenosis. Bilateral neural foraminal narrowing (severe right, moderate left). 4. At C5-C6, there is mild relative spinal canal stenosis (with slight flattening of the ventral spinal cord). Bilateral neural foraminal narrowing (moderate right, mild left). 5. No significant spinal canal stenosis, and no more than mild neural foraminal narrowing, at the remaining levels. 6. Disc degeneration is greatest at C7-T1 (mild-to-moderate at this level). 7. Slight grade 1 anterolisthesis at C2-C3, C3-C4 and C4-C5.     Electronically Signed   By: Rockey Childs D.O.   On: 09/17/2023 17:55  I have personally reviewed the images and agree with  the above interpretation.  Assessment and Plan: Mary Barry is a pleasant 43 y.o. 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  She has a pseudoarthrosis at C6-7.  She has severe foraminal stenosis at C5-6.  She has moderate central stenosis at C6-7 as well as a pseudoarthrosis at C6-7 I think she has significant symptoms secondary to both these issues.  I have recommended surgical intervention as she has had symptoms for more than 5 years.  There is no role for conservative management for cervical myelopathy.  I have recommended C5-7 posterior cervical fusion with decompression.      Mary Buege K. Clois MD, [facility] Neurosurgery

## 2023-11-20 NOTE — Discharge Instructions (Signed)
 Your surgeon has performed an operation on your cervical spine to relieve pressure on one or more nerves. Many times, patients feel better immediately after surgery and can "overdo it." Even if you feel well, it is important that you follow these activity guidelines. If you do not let your back heal properly from the surgery, you can increase the chance of hardware complications and/or return of your symptoms. The following are instructions to help in your recovery once you have been discharged from the hospital.  Do not use NSAIDs for 3 months after surgery.  *Regarding compression stockings-  Please wear day and night until you are walking a couple hundred feet three times a day.   Activity    No bending, lifting, or twisting ("BLT"). Avoid lifting objects heavier than 10 pounds (gallon milk jug).  Where possible, avoid household activities that involve lifting, bending, pushing, or pulling such as laundry, vacuuming, grocery shopping, and childcare. Try to arrange for help from friends and family for these activities while your back heals.  Increase physical activity slowly as tolerated.  Taking short walks is encouraged, but avoid strenuous exercise. Do not jog, run, bicycle, lift weights, or participate in any other exercises unless specifically allowed by your doctor. Avoid prolonged sitting, including car rides.  Talk to your doctor before resuming sexual activity.  You should not drive until cleared by your doctor.  Until released by your doctor, you should not return to work or school.  You should rest at home and let your body heal.   You may shower three days after your surgery.  After showering, lightly dab your incision dry. Do not take a tub bath or go swimming for 3 weeks, or until approved by your doctor at your follow-up appointment.  If you smoke, we strongly recommend that you quit.  Smoking has been proven to interfere with normal healing in your back and will dramatically  reduce the success rate of your surgery. Please contact [facility] (800-QUIT-NOW) and use the resources at www.[facility].com for assistance in stopping smoking.  Surgical Incision   If you have a dressing on your incision, you may remove it three days after your surgery. Keep your incision area clean and dry.  Your incision was closed with Dermabond glue. The glue should begin to peel away within about a week.  Diet            You may return to your usual diet. Be sure to stay hydrated.  When to Contact Us   Although your surgery and recovery will likely be uneventful, you may have some residual numbness, aches, and pains in your back and/or legs. This is normal and should improve in the next few weeks.  However, should you experience any of the following, contact us  immediately: New numbness or weakness Pain that is progressively getting worse, and is not relieved by your pain medications or rest Bleeding, redness, swelling, pain, or drainage from surgical incision Chills or flu-like symptoms Fever greater than 101.0 F (38.3 C) Problems with bowel or bladder functions Difficulty breathing or shortness of breath Warmth, tenderness, or swelling in your calf  Contact Information How to contact us :  If you have any questions/concerns before or after surgery, you can reach us  at [phone], or you can send a mychart message. We can be reached by phone or mychart 8am-4pm, Monday-Friday.  *Please note: Calls after 4pm are forwarded to a third party answering service. Mychart messages are not routinely monitored during evenings, weekends, and  holidays. Please call our office to contact the answering service for urgent concerns during non-business hours.

## 2023-11-20 NOTE — Telephone Encounter (Signed)
 Please see below message and advise.     Media Information  Document Information  AMB Correspondence  NEUROSURGERY ANSWERING SERVICE CALL  11/19/2023 09:02  Attached To:  Gaetana Eliberto Shove  Source Information  Default, Provider, MD

## 2023-11-20 NOTE — Anesthesia Procedure Notes (Signed)
 Procedure Name: Intubation Date/Time: 11/20/2023 7:25 AM  Performed by: Veronica Alm BROCKS, CRNAPre-anesthesia Checklist: Patient identified, Patient being monitored, Timeout performed, Emergency Drugs available and Suction available Patient Re-evaluated:Patient Re-evaluated prior to induction Oxygen Delivery Method: Circle system utilized Preoxygenation: Pre-oxygenation with 100% oxygen Induction Type: IV induction Ventilation: Mask ventilation without difficulty Laryngoscope Size: McGrath and 4 Grade View: Grade I Tube type: Oral Tube size: 7.5 mm Number of attempts: 1 Airway Equipment and Method: Stylet Placement Confirmation: ETT inserted through vocal cords under direct vision, positive ETCO2 and breath sounds checked- equal and bilateral Secured at: 21 cm Tube secured with: Tape Dental Injury: Teeth and Oropharynx as per pre-operative assessment

## 2023-11-20 NOTE — Telephone Encounter (Signed)
 Patient in surgery currently. Has requested an excuse note for children. Pended a note in her chart for follow up post surgery. (Need names of children and if this is for work or school)

## 2023-11-20 NOTE — Anesthesia Preprocedure Evaluation (Signed)
 Anesthesia Evaluation  Patient identified by MRN, date of birth, ID band Patient awake    Reviewed: Allergy & Precautions, NPO status , Patient's Chart, lab work & pertinent test results  Airway Mallampati: III  TM Distance: >3 FB Neck ROM: full    Dental  (+) Chipped   Pulmonary neg pulmonary ROS, former smoker   Pulmonary exam normal        Cardiovascular hypertension, On Medications and On Home Beta Blockers negative cardio ROS Normal cardiovascular exam     Neuro/Psych  PSYCHIATRIC DISORDERS  Depression     Neuromuscular disease    GI/Hepatic negative GI ROS, Neg liver ROS,,,  Endo/Other  diabetes  Class 3 obesity  Renal/GU      Musculoskeletal   Abdominal   Peds  Hematology negative hematology ROS (+)   Anesthesia Other Findings Past Medical History: No date: Anemia No date: Back pain 09/06/2020: Cardiac hypertrophy 09/06/2020: Chronic left-sided low back pain with bilateral sciatica No date: Depression No date: Diabetes mellitus without complication (HCC) No date: Hypertension No date: Neck pain 04/10/2023: Neck pain 04/10/2023: Symptomatic mammary hypertrophy  Past Surgical History: No date: ABLATION 1993: BREAST MASS EXCISION; Left No date: cervical fusin No date: CESAREAN SECTION No date: DILATION AND CURETTAGE OF UTERUS     Reproductive/Obstetrics negative OB ROS                              Anesthesia Physical Anesthesia Plan  ASA: 3  Anesthesia Plan: General ETT   Post-op Pain Management:    Induction: Intravenous  PONV Risk Score and Plan: 3 and Propofol infusion, Midazolam, TIVA, Dexamethasone and Ondansetron  Airway Management Planned: Oral ETT  Additional Equipment:   Intra-op Plan:   Post-operative Plan: Extubation in OR  Informed Consent: I have reviewed the patients History and Physical, chart, labs and discussed the procedure including the  risks, benefits and alternatives for the proposed anesthesia with the patient or authorized representative who has indicated his/her understanding and acceptance.     Dental Advisory Given  Plan Discussed with: Anesthesiologist, CRNA and Surgeon  Anesthesia Plan Comments: (Patient consented for risks of anesthesia including but not limited to:  - adverse reactions to medications - damage to eyes, teeth, lips or other oral mucosa - nerve damage due to positioning  - sore throat or hoarseness - Damage to heart, brain, nerves, lungs, other parts of body or loss of life  Patient voiced understanding and assent.)        Anesthesia Quick Evaluation

## 2023-11-20 NOTE — Inpatient Diabetes Management (Signed)
 Inpatient Diabetes Program Recommendations  AACE/ADA: New Consensus Statement on Inpatient Glycemic Control (2015)  Target Ranges:  Prepandial:   less than 140 mg/dL      Peak postprandial:   less than 180 mg/dL (1-2 hours)      Critically ill patients:  140 - 180 mg/dL    Latest Reference Range & Units 11/20/23 06:24 11/20/23 10:14  Glucose-Capillary 70 - 99 mg/dL 859 (H) 854 (H)   [id number] 7.5 - 10/18/2023   Review of Glycemic Control  Diabetes history: DM1   Outpatient Diabetes medications:  Sees Dr Damian - last visit was on 10/18/23   Omnipod Insulin Pump - Humalog  Basal Rates 12am: 0.65 units/hr  6am: 1.15 units/hr 2pm: 1.35 units/hr 11pm: 1.05 units/hr 24hr basal total = 26.3 units   Bolus settings  I:C ratio 12am: 9.5, 5am: 8.3, 6pm: 9.5 Sensitivity 50  Insulin On Board (hours): 4 hours Target Glucose: 110 Correction threshold: 120   Current orders for Inpatient glycemic control:   Novolog 0-15 units TID + 0-5 units at bedtime.  Metformin 1,000mg  daily   Inpatient Diabetes Program Recommendations:   Patient received Decadron at 0755 this morning   *Note patient has Type 1 Diabetes*   Spoke with patient and family at bedside. Confirmed patient IS wearing insulin pump and Dexcom CGM. Omnipod insulin pump is currently on right upper arm. Spoke with patient regarding diabetes and home regimen for diabetes management. Patient uses a Omnipod insulin pump with Humalog insulin as an outpatient.  Confirmed Omnipod/Dexcom CGM in is Automated Mode (meaning basal is adjusted depending on blood sugar readings on Dexcom CGM). Patient and family states that she would prefer to use his insulin pump.   Message sent to [place], [place] to make her aware.  Insulin pump order set placed.   Note to bedside RN - NURSING: Once insulin pump order set is ordered please print off the Patient insulin pump contract and flow sheet. The insulin pump contract should be signed by the patient and  then placed in the chart. The patient insulin pump flow sheet will be completed by the patient at the bedside and the RN caring for the patient will use the patient's flow sheet to document in the [facility]. RN will need to complete the Nursing Insulin Pump Flowsheet at least once a shift. Patient will need to keep extra insulin pump supplies at the bedside at all times.    Thanks,  Lavanda Search, RN, MSN, [facility]  Inpatient Diabetes Coordinator  Pager [phone] (8a-5p)

## 2023-11-20 NOTE — Telephone Encounter (Signed)
 Left voicemail and mychart message for excuse notes for children.

## 2023-11-21 ENCOUNTER — Encounter: Payer: Self-pay | Admitting: Neurosurgery

## 2023-11-21 LAB — GLUCOSE, CAPILLARY
Glucose-Capillary: 127 mg/dL — ABNORMAL HIGH (ref 70–99)
Glucose-Capillary: 121 mg/dL — ABNORMAL HIGH (ref 70–99)
Glucose-Capillary: 53 mg/dL — ABNORMAL LOW (ref 70–99)
Glucose-Capillary: 69 mg/dL — ABNORMAL LOW (ref 70–99)
Glucose-Capillary: 97 mg/dL (ref 70–99)
Glucose-Capillary: 72 mg/dL (ref 70–99)

## 2023-11-21 MED ORDER — CELECOXIB 200 MG PO CAPS
200.0000 mg | ORAL_CAPSULE | Freq: Two times a day (BID) | ORAL | Status: DC
  Administered 2023-11-21 – 2023-11-23 (×5): 200 mg via ORAL
  Filled 2023-11-21 (×5): qty 1

## 2023-11-21 NOTE — Evaluation (Signed)
 Physical Therapy Evaluation Patient Details Name: Mary Barry MRN: [id number] DOB: 07/14/1980 Today's Date: 11/21/2023  History of Present Illness  Mary Barry is 43 y.o. female presenting with cervical stenosis and pseudoarthrosis now s/p posterior cervical decompression and fusion on 11/20/23. PMH significant for DM, diabetic gastroparesis, cardiac hypertrophy, GERD, HTN, HLD, gastric ulcers.   Clinical Impression  Pt A&Ox4, pleasant and agreeable to PT evaluation. At baseline, pt is IND with mobility/ADLs, no previous AD use, denies hx of falls. Pt was met sitting in recliner, CGA for initial STS from recliner. Pt initially amb with SPC, however was noted to be unsteady, additional amb completed with bariatric RW. Several pt-directed standing rest breaks taken over bout of amb, pt's knees noted to buckle with fatigue with 2 near LOB requiring pt to lean against wall and minA to correct and prevent full LOB. Pt began to report feeling near-syncopal upon return to room, promptly returned to seated and assessed vitals which remained WFL, symptoms resolved with seated rest- MD/RN notified. Pt returned to semi-supine in bed at end of session, all needs in reach. Pt would benefit from skilled PT intervention to address listed deficits (see PT Problem List) and allow for safe return to PLOF.       If plan is discharge home, recommend the following: A little help with walking and/or transfers;A little help with bathing/dressing/bathroom;Assist for transportation;Help with stairs or ramp for entrance   Can travel by private vehicle        Equipment Recommendations Rolling walker (2 wheels) (bariatric RW)  Recommendations for Other Services       Functional Status Assessment Patient has had a recent decline in their functional status and demonstrates the ability to make significant improvements in function in a reasonable and predictable amount of time.     Precautions /  Restrictions Precautions Precautions: Cervical;Fall Recall of Precautions/Restrictions: Intact Precaution/Restrictions Comments: no brace required Restrictions Weight Bearing Restrictions Per Provider Order: No      Mobility  Bed Mobility Overal bed mobility: Modified Independent             General bed mobility comments: no physical assistance, use of bed features    Transfers Overall transfer level: Needs assistance Equipment used: None, Rolling walker (2 wheels) Transfers: Sit to/from Stand Sit to Stand: Contact guard assist, Min assist           General transfer comment: initial STS from recliner with no AD and CGA, additional STS from low chair with RW and minA to assist with rising and stabilize RW    Ambulation/Gait Ambulation/Gait assistance: Contact guard assist, Min assist Gait Distance (Feet): 200 Feet Assistive device: Straight cane, Rolling walker (2 wheels) Gait Pattern/deviations: Step-through pattern, Knees buckling, Wide base of support Gait velocity: decreased     General Gait Details: initially amb with SPC, progressed to RW due to bilateral knees buckling and unsteadiness. Pt with 2 instances of near LOB due to buckling that required minA to correct. Severeal pt-directed standing rest breaks during amb.  Stairs            Wheelchair Mobility     Tilt Bed    Modified Rankin (Stroke Patients Only)       Balance Overall balance assessment: Needs assistance Sitting-balance support: Feet supported Sitting balance-Leahy Scale: Normal     Standing balance support: Single extremity supported, Bilateral upper extremity supported Standing balance-Leahy Scale: Fair Standing balance comment: increased postural sway with no UE support  Pertinent Vitals/Pain Pain Assessment Pain Assessment: 0-10 Pain Score: 10-Worst pain ever Pain Location: neck Pain Descriptors / Indicators: Grimacing,  Heaviness Pain Intervention(s): Limited activity within patient's tolerance, Monitored during session, Premedicated before session, Repositioned    Home Living Family/patient expects to be discharged to:: Private residence Living Arrangements: Children Available Help at Discharge: Family;Available PRN/intermittently Type of Home: Apartment Home Access: Stairs to enter Entrance Stairs-Rails: Can reach both Entrance Stairs-Number of Steps: 15   Home Layout: One level Home Equipment: None      Prior Function Prior Level of Function : Independent/Modified Independent;Driving;Working/employed             Mobility Comments: IND with mobility, no previous AD use, denies hx of falls ADLs Comments: IND with ADLs, works in [employer]     Extremity/Trunk Assessment   Upper Extremity Assessment Upper Extremity Assessment: Defer to OT evaluation LUE Deficits / Details: 4/5 grossly    Lower Extremity Assessment Lower Extremity Assessment: RLE deficits/detail;LLE deficits/detail RLE Deficits / Details: grossly WFL, functional weakness in standing with knee buckling RLE Sensation: WNL LLE Deficits / Details: grossly WFL, functional weakness in standing with knee buckling LLE Sensation: decreased light touch (pt reported this is a chronic sensation loss) LLE Coordination: WNL       Communication   Communication Communication: No apparent difficulties    Cognition Arousal: Alert Behavior During Therapy: WFL for tasks assessed/performed   PT - Cognitive impairments: No apparent impairments                       PT - Cognition Comments: A&Ox4, pleasant Following commands: Intact       Cueing Cueing Techniques: Verbal cues, Visual cues     General Comments General comments (skin integrity, edema, etc.): honeycomb dressing over posterior neck dry and intact throughout session    Exercises Other Exercises Other Exercises: Vitals monitored after amb due to pt  c/o feeling like she was going to pass out: BP 120/72 (88) HR 74, SpO2 98% on RA. Pt's symptoms resolved with sitting rest break   Assessment/Plan    PT Assessment Patient needs continued PT services  PT Problem List Decreased activity tolerance;Decreased balance;Decreased mobility;Decreased knowledge of use of DME;Pain       PT Treatment Interventions DME instruction;Gait training;Stair training;Functional mobility training;Therapeutic activities;Therapeutic exercise;Balance training;Neuromuscular re-education;Patient/family education    PT Goals (Current goals can be found in the Care Plan section)  Acute Rehab PT Goals Patient Stated Goal: to go home PT Goal Formulation: With patient Time For Goal Achievement: 12/05/23 Potential to Achieve Goals: Fair    Frequency BID     Co-evaluation               AM-PAC PT 6 Clicks Mobility  Outcome Measure Help needed turning from your back to your side while in a flat bed without using bedrails?: None Help needed moving from lying on your back to sitting on the side of a flat bed without using bedrails?: A Little Help needed moving to and from a bed to a chair (including a wheelchair)?: A Little Help needed standing up from a chair using your arms (e.g., wheelchair or bedside chair)?: A Little Help needed to walk in hospital room?: A Little Help needed climbing 3-5 steps with a railing? : A Lot 6 Click Score: 18    End of Session Equipment Utilized During Treatment: Gait belt Activity Tolerance: Patient tolerated treatment well;Patient limited by pain Patient left: in bed;with  call bell/phone within reach;with bed alarm set;with SCD's reapplied Nurse Communication: Mobility status PT Visit Diagnosis: Unsteadiness on feet (R26.81);Other abnormalities of gait and mobility (R26.89);Difficulty in walking, not elsewhere classified (R26.2);Pain Pain - part of body:  (neck)    Time: 8983-8945 PT Time Calculation (min) (ACUTE  ONLY): 38 min   Charges:   PT Evaluation $PT Eval Moderate Complexity: 1 Mod PT Treatments $Gait Training: 8-22 mins PT General Charges $$ ACUTE PT VISIT: 1 Visit         Janell Axe, SPT

## 2023-11-21 NOTE — Progress Notes (Signed)
 Physical Therapy Treatment Patient Details Name: Mary Barry MRN: [id number] DOB: 1980/12/15 Today's Date: 11/21/2023   History of Present Illness Mary Barry is 43 y.o. female presenting with cervical stenosis and pseudoarthrosis now s/p posterior cervical decompression and fusion on 11/20/23. PMH significant for DM, diabetic gastroparesis, cardiac hypertrophy, GERD, HTN, HLD, gastric ulcers.    PT Comments  Pt was in BR upon arrival. She is agreeable to session and remains cooperative throughout. A and O x 4. Pt BP while standing 118/73. Pt endorses 8/10 pain but was just given pain meds prior to session. She tolerates ambulation ~ 80 ft with BRW. Slight knee buckling present so author elected not to have pt perform stairs until next date/ session. Pt does have 15 stairs to enter home. She needs increased time to return to bed after ambulation but overall is progressing towards goals. Reviewed spinal precautions and importance of fall prevention. She will benefit from continued skilled PT at DC to maximize her independence and safety with all ADLs.     If plan is discharge home, recommend the following: A little help with walking and/or transfers;A little help with bathing/dressing/bathroom;Assist for transportation;Help with stairs or ramp for entrance     Equipment Recommendations  Rolling walker (2 wheels);Other (comment) (Bariatric- Pt will benefit from bariatric RW due to pt's body habitus and need for wider frame/BOS.)       Precautions / Restrictions Precautions Precautions: Cervical;Fall Recall of Precautions/Restrictions: Intact Precaution/Restrictions Comments: no brace required Restrictions Weight Bearing Restrictions Per Provider Order: No     Mobility  Bed Mobility Overal bed mobility: Needs Assistance Bed Mobility: Sit to Supine  Sit to supine: Supervision General bed mobility comments: pt was encouraged to perform sidelying to supine however pt states, I  can't do it like that, let me do it my way. No physical assistance required to get into bed with HOB elevated.    Transfers Overall transfer level: Needs assistance Equipment used: Rolling walker (2 wheels) Transfers: Sit to/from Stand Sit to Stand: Contact guard assist  General transfer comment: CGA for safety to stand from EOB and from recliner    Ambulation/Gait Ambulation/Gait assistance: Contact guard assist, Min assist Gait Distance (Feet): 80 Feet Assistive device: Rolling walker (2 wheels) (Bariatric) Gait Pattern/deviations: Step-through pattern, Knees buckling Gait velocity: decreased  General Gait Details: Pt continues to present with BLE weakness.  Slight knee buckling throughout ambulation however no intervention required to prevent falling. Chair follow throughout for additional safety.    Balance Overall balance assessment: Needs assistance Sitting-balance support: Feet supported Sitting balance-Leahy Scale: Normal     Standing balance support: Bilateral upper extremity supported, During functional activity, Reliant on assistive device for balance Standing balance-Leahy Scale: Fair Standing balance comment: pt is at high risk of falls due to BLE weakness/slight knee buckling. Gait belt and chair follow for additional safety.       Communication Communication Communication: No apparent difficulties  Cognition Arousal: Alert Behavior During Therapy: WFL for tasks assessed/performed   PT - Cognitive impairments: No apparent impairments   PT - Cognition Comments: Pt remains A and O x 4 and motivated/pleasant Following commands: Intact      Cueing Cueing Techniques: Verbal cues, Tactile cues     General Comments General comments (skin integrity, edema, etc.): honeycomb dressing over posterior neck dry and intact throughout session      Pertinent Vitals/Pain Pain Assessment Pain Assessment: 0-10 Pain Score: 8  Pain Location: neck Pain Intervention(s):  Limited  activity within patient's tolerance, Monitored during session, Premedicated before session, Repositioned, Ice applied    Home Living Family/patient expects to be discharged to:: Private residence Living Arrangements: Children Available Help at Discharge: Family;Available PRN/intermittently Type of Home: Apartment Home Access: Stairs to enter Entrance Stairs-Rails: Can reach both Entrance Stairs-Number of Steps: 15   Home Layout: One level Home Equipment: None          PT Goals (current goals can now be found in the care plan section) Acute Rehab PT Goals Patient Stated Goal: to go home with my son and dog PT Goal Formulation: With patient Time For Goal Achievement: 12/05/23 Potential to Achieve Goals: Fair Progress towards PT goals: Progressing toward goals    Frequency    BID       AM-PAC PT 6 Clicks Mobility   Outcome Measure  Help needed turning from your back to your side while in a flat bed without using bedrails?: None Help needed moving from lying on your back to sitting on the side of a flat bed without using bedrails?: A Little Help needed moving to and from a bed to a chair (including a wheelchair)?: A Little Help needed standing up from a chair using your arms (e.g., wheelchair or bedside chair)?: A Little Help needed to walk in hospital room?: A Little Help needed climbing 3-5 steps with a railing? : A Lot 6 Click Score: 18    End of Session Equipment Utilized During Treatment: Gait belt Activity Tolerance: Patient tolerated treatment well Patient left: in bed;with call bell/phone within reach;with bed alarm set;with SCD's reapplied Nurse Communication: Mobility status PT Visit Diagnosis: Unsteadiness on feet (R26.81);Other abnormalities of gait and mobility (R26.89);Difficulty in walking, not elsewhere classified (R26.2);Pain Pain - part of body:  (neck)     Time: 8566-8496 PT Time Calculation (min) (ACUTE ONLY): 30 min  Charges:     $Gait Training: 8-22 mins $Therapeutic Activity: 8-22 mins PT General Charges $$ ACUTE PT VISIT: 1 Visit                    Rankin Essex PTA 11/21/23, 3:54 PM

## 2023-11-21 NOTE — TOC Initial Note (Signed)
 Transition of Care [facility]) - Initial/Assessment Note    Patient Details  Name: Mary Barry MRN: [id number] Date of Birth: [birth year]  Transition [facility]) CM/SW Contact:    Marinda Cooks, RN Phone Number: 11/21/2023, 1:44 PM  Clinical Narrative:                 This CM spoke with pt introduced role & completed Initial assessement. Pt A&Ox4 Pt reports living in a Apt on [address] level  with  a fight of steps to reach front door. Pt has family support provided by  son's ages 70 & 74. [facility] in [place] on [address]  & PCP is listed as   Dr. Selinda whitaker , pt confirms she has established care with this PCP. Pt denies  being in SNF prior to  this admission or having HH services coordinated . Pt denies community resources in place currently. Pt informed she used to work at  [employer], however she is unemployed now for x 3 months and expressed her son's help her pay the bills and she remains on her ex [employer]. Pt denied using any DME for mobility prior to this admission.TOC will cont to follow pt's dc planning / care coordination  during hospital stay and update as applicable.     Expected Discharge Plan and Services  TBD         Prior Living Arrangements/Services   Home with Children       Activities of Daily Living   ADL Screening (condition at time of admission) Independently performs ADLs?: Yes (appropriate for developmental age) Is the patient deaf or have difficulty hearing?: No Does the patient have difficulty seeing, even when wearing glasses/contacts?: No Does the patient have difficulty concentrating, remembering, or making decisions?: No  Permission Sought/Granted                  Emotional Assessment              Admission diagnosis:  Cervical radiculopathy [M54.12] Pseudarthrosis following spinal fusion [M96.0] Cervical stenosis of spinal canal [M48.02] Cervical myelopathy (HCC) [G95.9] S/P cervical spinal fusion  [Z98.1] Patient Active Problem List   Diagnosis Date Noted   Cervical myelopathy (HCC) 11/20/2023   Cervical radiculopathy 11/20/2023   Cervical stenosis of spinal canal 11/20/2023   Pseudarthrosis following spinal fusion 11/20/2023   S/P cervical spinal fusion 11/20/2023   Anemia 10/02/2023   Depressive disorder 10/02/2023   Back pain 04/10/2023   Neck pain 04/10/2023   Symptomatic mammary hypertrophy 04/10/2023   Benign essential HTN 10/04/2021   Cardiac hypertrophy 09/06/2020   Chronic left-sided low back pain with bilateral sciatica 09/06/2020   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 [place] [address] Phone: [phone] Fax: [phone]  [facility] [address] [place] [address] Phone: [phone] Fax: [phone]     Social Drivers of Health (SDOH) Social History: SDOH Screenings   Food Insecurity: No Food Insecurity (11/20/2023)  Housing: Low Risk  (11/20/2023)  Transportation Needs: No Transportation Needs (11/20/2023)  Utilities: Not At Risk (11/20/2023)  Financial Resource Strain: High Risk (10/08/2022)   Received from [facility]  Physical Activity: Sufficiently Active (02/27/2021)   Received from [facility]  Stress: No Stress Concern Present (02/27/2021)   Received  from [facility]  Tobacco Use: Medium Risk (11/20/2023)   SDOH Interventions:     Readmission Risk Interventions     No data to display

## 2023-11-21 NOTE — Progress Notes (Signed)
 Neurosurgery Progress Note  History: Mary Barry is here for cervical stenosis and pseudoarthrosis  POD1: Expected pain, doing well  Physical Exam: Vitals:   11/21/23 0428 11/21/23 0735  BP: (!) 110/55 107/66  Pulse: 78 72  Resp: 17 17  Temp: 98.4 F (36.9 C) 98.3 F (36.8 C)  SpO2: 100% 100%    AA Ox3 CNI  Strength:5/5 throughout BUE  Data:  Other tests/results: drain 140 overnight  Assessment/Plan:  Mary Barry is doing as expected after posterior cervical decompression and fusion.  - mobilize - pain control - DVT prophylaxis - PTOT - Monitor drain  Reeves Daisy MD, [facility] Department of Neurosurgery

## 2023-11-21 NOTE — Plan of Care (Signed)
  Problem: Education: Goal: Knowledge of General Education information will improve Description: Including pain rating scale, medication(s)/side effects and non-pharmacologic comfort measures Outcome: Progressing   Problem: Clinical Measurements: Goal: Ability to maintain clinical measurements within normal limits will improve Outcome: Progressing   Problem: Activity: Goal: Risk for activity intolerance will decrease Outcome: Progressing   Problem: Pain Managment: Goal: General experience of comfort will improve and/or be controlled Outcome: Progressing   Problem: Safety: Goal: Ability to remain free from injury will improve Outcome: Progressing   Problem: Skin Integrity: Goal: Risk for impaired skin integrity will decrease Outcome: Progressing   Problem: Metabolic: Goal: Ability to maintain appropriate glucose levels will improve Outcome: Progressing   Problem: Activity: Goal: Ability to avoid complications of mobility impairment will improve Outcome: Progressing Goal: Will remain free from falls Outcome: Progressing   Problem: Clinical Measurements: Goal: Postoperative complications will be avoided or minimized Outcome: Progressing   Problem: Pain Management: Goal: Pain level will decrease Outcome: Progressing   Problem: Bladder/Genitourinary: Goal: Urinary functional status for postoperative course will improve Outcome: Progressing

## 2023-11-21 NOTE — Evaluation (Signed)
 Occupational Therapy Evaluation Patient Details Name: Rakel Junio MRN: [id number] DOB: 12-04-1980 Today's Date: 11/21/2023   History of Present Illness   Hedaya Latendresse is 43 y.o. female presenting with cervical stenosis and pseudoarthrosis now s/p posterior cervical decompression and fusion on 11/20/23    Clinical Impressions Ms Eliberto was seen for OT evaluation this date. Prior to hospital admission, pt was IND. Pt lives with adult children in 2nd floor apartment. Pt limited by pain and dominant LUE weakness grossly 4/5. Pt currently requires CGA for ADL t/f no AD use, pt reports mild dizziness and furniture walking noted. SETUP + SUPERVISION standing grooming tasks, improves to SETUP only in sitting. MOD I for UB/LB dressing. Pt educated in functional application of cervical precautions, AE/DME for bathing/dressing, and compression sock mgmt. Pt verbalized understanding of all education/training provided. Handout provided to support recall and carry over of learned precautions/techniques. All education complete, will sign off. Upon hospital discharge, recommend OT follow up to progress LUE.     If plan is discharge home, recommend the following:   A little help with walking and/or transfers;A little help with bathing/dressing/bathroom;Help with stairs or ramp for entrance     Functional Status Assessment   Patient has had a recent decline in their functional status and demonstrates the ability to make significant improvements in function in a reasonable and predictable amount of time.     Equipment Recommendations   Tub/shower seat     Recommendations for Other Services         Precautions/Restrictions   Precautions Precautions: Cervical Precaution Booklet Issued: Yes (comment) Recall of Precautions/Restrictions: Intact Restrictions Weight Bearing Restrictions Per Provider Order: No     Mobility Bed Mobility Overal bed mobility: Modified Independent              General bed mobility comments: bed controls    Transfers Overall transfer level: Needs assistance Equipment used: None Transfers: Sit to/from Stand Sit to Stand: Supervision                  Balance Overall balance assessment: Needs assistance Sitting-balance support: No upper extremity supported, Feet supported Sitting balance-Leahy Scale: Normal     Standing balance support: No upper extremity supported, During functional activity Standing balance-Leahy Scale: Good                             ADL either performed or assessed with clinical judgement   ADL Overall ADL's : Needs assistance/impaired                                       General ADL Comments: CGA for ADL t/f no AD use, pt reports mild dizziness. SETUP + SUPERVISION standing grooming tasks, improves to SETUP only in sitting. MOD I for UB/LB dressing      Pertinent Vitals/Pain Pain Assessment Pain Assessment: 0-10 Pain Score: 10-Worst pain ever Pain Location: neck Pain Descriptors / Indicators: Grimacing, Guarding, Pressure Pain Intervention(s): Limited activity within patient's tolerance, Repositioned, Patient requesting pain meds-RN notified     Extremity/Trunk Assessment Upper Extremity Assessment Upper Extremity Assessment: LUE deficits/detail;Left hand dominant LUE Deficits / Details: 4/5 grossly   Lower Extremity Assessment Lower Extremity Assessment: Overall WFL for tasks assessed       Communication Communication Communication: No apparent difficulties   Cognition Arousal: Alert Behavior During Therapy: [facility]  for tasks assessed/performed Cognition: No apparent impairments                               Following commands: Intact                  Home Living Family/patient expects to be discharged to:: Private residence Living Arrangements: Children Available Help at Discharge: Family;Available PRN/intermittently Type  of Home: Apartment Home Access: Stairs to enter Entrance Stairs-Number of Steps: 15 Entrance Stairs-Rails: Can reach both Home Layout: One level     Bathroom Shower/Tub: Producer, television/film/video: Standard     Home Equipment: None          Prior Functioning/Environment Prior Level of Function : Independent/Modified Independent;Driving;Working/employed                    OT Problem List: Decreased strength;Decreased range of motion;Decreased activity tolerance;Impaired balance (sitting and/or standing)   OT Treatment/Interventions:        OT Goals(Current goals can be found in the care plan section)   Acute Rehab OT Goals Patient Stated Goal: to improve pain OT Goal Formulation: With patient Time For Goal Achievement: 11/21/23 Potential to Achieve Goals: Good   OT Frequency:       Co-evaluation              AM-PAC OT 6 Clicks Daily Activity     Outcome Measure Help from another person eating meals?: None Help from another person taking care of personal grooming?: None Help from another person toileting, which includes using toliet, bedpan, or urinal?: None Help from another person bathing (including washing, rinsing, drying)?: A Little Help from another person to put on and taking off regular upper body clothing?: None Help from another person to put on and taking off regular lower body clothing?: None 6 Click Score: 23   End of Session Nurse Communication: Mobility status;Patient requests pain meds  Activity Tolerance: Patient tolerated treatment well;Patient limited by pain Patient left: in chair;with call bell/phone within reach  OT Visit Diagnosis: Other abnormalities of gait and mobility (R26.89);Muscle weakness (generalized) (M62.81)                Time: 9153-9067 OT Time Calculation (min): 46 min Charges:  OT General Charges $OT Visit: 1 Visit OT Evaluation $OT Eval Low Complexity: 1 Low OT Treatments $Self Care/Home  Management : 38-52 mins  Elston Slot, M.S. OTR/L  11/21/23, 11:01 AM  ascom [phone]

## 2023-11-22 LAB — GLUCOSE, CAPILLARY
Glucose-Capillary: 154 mg/dL — ABNORMAL HIGH (ref 70–99)
Glucose-Capillary: 212 mg/dL — ABNORMAL HIGH (ref 70–99)
Glucose-Capillary: 230 mg/dL — ABNORMAL HIGH (ref 70–99)
Glucose-Capillary: 96 mg/dL (ref 70–99)

## 2023-11-22 NOTE — Telephone Encounter (Signed)
 Spoke with [name] about getting a work note for her son. Created a letter and sent to patient via mychart.
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
## 2023-11-22 NOTE — Progress Notes (Signed)
 Inpatient Rehab Admissions Coordinator:   Per therapy recommendations, patient was screened for CIR candidacy by Leita Kleine, MS, CCC-SLP. At this time, Pt. Is contact guard and walking 100 ft on eval.  Pt. does not appear to enough functional deficits to justify in hospital rehabilitation/CIR. I will not pursue a rehab consult for this Pt.   Recommend other rehab venues to be pursued.  Please contact me with any questions.   Leita Kleine, MS, CCC-SLP Rehab Admissions Coordinator  [phone] (celll) [phone] (office)

## 2023-11-22 NOTE — Progress Notes (Signed)
 Physical Therapy Treatment Patient Details Name: Mary Barry MRN: [id number] DOB: Dec 23, 1980 Today's Date: 11/22/2023   History of Present Illness Mary Barry is 43 y.o. female presenting with cervical stenosis and pseudoarthrosis now s/p posterior cervical decompression and fusion on 11/20/23. PMH significant for DM, diabetic gastroparesis, cardiac hypertrophy, GERD, HTN, HLD, gastric ulcers.    PT Comments  Pt was pleasant and motivated to participate during the session and put forth good effort throughout. Pt required extra time and effort with bed mobility tasks but no physical assistance.  Pt was able to come to standing without assist but required the bed height to be elevated along with cuing for general sequencing and required notable time and effort to come to full upright position.  Pt presented with poor gait kinematics per below and is at an elevated risk for falling.  Pt will benefit from continued PT services upon discharge to safely address deficits listed in patient problem list for decreased caregiver assistance and eventual return to PLOF.      If plan is discharge home, recommend the following: A little help with bathing/dressing/bathroom;Assist for transportation;Help with stairs or ramp for entrance;A lot of help with walking and/or transfers   Can travel by private vehicle        Equipment Recommendations  Rolling walker (2 wheels);Other (comment) (Pt will benefit from a bariatric RW due to body habitus)    Recommendations for Other Services       Precautions / Restrictions Precautions Precautions: Cervical;Fall Precaution Booklet Issued: Yes (comment) Recall of Precautions/Restrictions: Intact Precaution/Restrictions Comments: no brace required Restrictions Weight Bearing Restrictions Per Provider Order: No     Mobility  Bed Mobility Overal bed mobility: Modified Independent             General bed mobility comments: Min extra time,  effort, and use of the bed rails only    Transfers Overall transfer level: Needs assistance Equipment used: Rolling walker (2 wheels) Transfers: Sit to/from Stand Sit to Stand: Contact guard assist, From elevated surface           General transfer comment: Mod verbal cues for general sequencing with pt able to stand without assist but required the EOB to be elevated along with extra time and effort    Ambulation/Gait Ambulation/Gait assistance: Contact guard assist Gait Distance (Feet): 100 Feet Assistive device: Rolling walker (2 wheels) Gait Pattern/deviations: Step-through pattern, Knees buckling, Decreased step length - right, Decreased step length - left, Shuffle Gait velocity: decreased     General Gait Details: Slow cadence with short B step length with occassional shuffling steps, min knee buckling that the pt was able to self-correct, and general mild instability; pt able to amb 100 feet but required several short standing rest breaks to amb that distance   Stairs             Wheelchair Mobility     Tilt Bed    Modified Rankin (Stroke Patients Only)       Balance Overall balance assessment: Needs assistance   Sitting balance-Leahy Scale: Normal     Standing balance support: Bilateral upper extremity supported, During functional activity, Reliant on assistive device for balance Standing balance-Leahy Scale: Fair Standing balance comment: pt is at high risk of falls due to BLE weakness/slight knee buckling. Gait belt and chair follow utilized for additional safety.  Communication Communication Communication: No apparent difficulties  Cognition Arousal: Alert Behavior During Therapy: WFL for tasks assessed/performed   PT - Cognitive impairments: No apparent impairments                       PT - Cognition Comments: Pt remains A and O x 4 and motivated/pleasant Following commands: Intact       Cueing Cueing Techniques: Verbal cues, Tactile cues, Visual cues  Exercises      General Comments        Pertinent Vitals/Pain Pain Assessment Pain Assessment: 0-10 Pain Score: 10-Worst pain ever Pain Location: neck Pain Descriptors / Indicators: Aching, Sore Pain Intervention(s): Monitored during session, Premedicated before session, Repositioned    Home Living                          Prior Function            PT Goals (current goals can now be found in the care plan section) Progress towards PT goals: Progressing toward goals    Frequency    BID      PT Plan      Co-evaluation              AM-PAC PT 6 Clicks Mobility   Outcome Measure  Help needed turning from your back to your side while in a flat bed without using bedrails?: None Help needed moving from lying on your back to sitting on the side of a flat bed without using bedrails?: A Little Help needed moving to and from a bed to a chair (including a wheelchair)?: A Little Help needed standing up from a chair using your arms (e.g., wheelchair or bedside chair)?: A Little Help needed to walk in hospital room?: A Little Help needed climbing 3-5 steps with a railing? : A Lot 6 Click Score: 18    End of Session Equipment Utilized During Treatment: Gait belt;Other (comment) (chair follow during ambulation) Activity Tolerance: Patient tolerated treatment well Patient left: in bed;with call bell/phone within reach;with bed alarm set;with SCD's reapplied;with family/visitor present Nurse Communication: Mobility status PT Visit Diagnosis: Unsteadiness on feet (R26.81);Other abnormalities of gait and mobility (R26.89);Difficulty in walking, not elsewhere classified (R26.2);Pain     Time: 8955-8890 PT Time Calculation (min) (ACUTE ONLY): 25 min  Charges:    $Gait Training: 8-22 mins $Therapeutic Activity: 8-22 mins PT General Charges $$ ACUTE PT VISIT: 1 Visit                     D.  Scott Miraj Truss PT, DPT 11/22/23, 11:33 AM

## 2023-11-22 NOTE — Progress Notes (Signed)
 Neurosurgery Progress Note  History: Tayonna Bacha is here for cervical stenosis and pseudoarthrosis  POD2: Continued pain, somewhat unsteady with PT today POD1: Expected pain, doing well  Physical Exam: Vitals:   11/22/23 0838 11/22/23 1556  BP: 113/73 (!) 101/58  Pulse: 73 77  Resp: 17 16  Temp: 98.3 F (36.8 C) (!) 97.5 F (36.4 C)  SpO2: 100% 99%    AA Ox3 CNI  Strength:5/5 throughout BUE  Data:  Other tests/results: drain 115 today so far  Assessment/Plan:  Mary Barry is doing as expected after posterior cervical decompression and fusion.  - mobilize - pain control - DVT prophylaxis - PTOT - Monitor drain  Reeves Daisy MD, [facility] Department of Neurosurgery

## 2023-11-23 ENCOUNTER — Other Ambulatory Visit: Payer: Self-pay

## 2023-11-23 LAB — GLUCOSE, CAPILLARY
Glucose-Capillary: 114 mg/dL — ABNORMAL HIGH (ref 70–99)
Glucose-Capillary: 128 mg/dL — ABNORMAL HIGH (ref 70–99)
Glucose-Capillary: 200 mg/dL — ABNORMAL HIGH (ref 70–99)

## 2023-11-23 MED ORDER — CELECOXIB 200 MG PO CAPS
200.0000 mg | ORAL_CAPSULE | Freq: Two times a day (BID) | ORAL | 0 refills | Status: DC
  Filled 2023-11-23: qty 60, 30d supply, fill #0

## 2023-11-23 MED ORDER — CYCLOBENZAPRINE HCL 10 MG PO TABS
10.0000 mg | ORAL_TABLET | Freq: Three times a day (TID) | ORAL | 0 refills | Status: DC | PRN
  Filled 2023-11-23: qty 90, 30d supply, fill #0

## 2023-11-23 MED ORDER — SENNA 8.6 MG PO TABS
1.0000 | ORAL_TABLET | Freq: Two times a day (BID) | ORAL | 0 refills | Status: AC | PRN
  Filled 2023-11-23: qty 30, 15d supply, fill #0

## 2023-11-23 MED ORDER — OXYCODONE HCL 5 MG PO TABS
5.0000 mg | ORAL_TABLET | ORAL | 0 refills | Status: AC | PRN
  Filled 2023-11-23: qty 45, 8d supply, fill #0

## 2023-11-23 MED ORDER — ACETAMINOPHEN 500 MG PO TABS: 1000.0000 mg | ORAL_TABLET | Freq: Four times a day (QID) | ORAL | Status: AC

## 2023-11-23 MED ORDER — POLYETHYLENE GLYCOL 3350 17 GM/SCOOP PO POWD
17.0000 g | Freq: Every day | ORAL | 0 refills | Status: AC | PRN
  Filled 2023-11-23: qty 238, 14d supply, fill #0

## 2023-11-23 NOTE — Plan of Care (Signed)
 Problem: Education: Goal: Knowledge of General Education information will improve Description: Including pain rating scale, medication(s)/side effects and non-pharmacologic comfort measures 11/23/2023 0955 by Joshua Andrez PARAS, LPN Outcome: Adequate for Discharge 11/23/2023 0955 by Joshua Andrez PARAS, LPN Outcome: Adequate for Discharge   Problem: Health Behavior/Discharge Planning: Goal: Ability to manage health-related needs will improve 11/23/2023 0955 by Joshua Andrez PARAS, LPN Outcome: Adequate for Discharge 11/23/2023 0955 by Joshua Andrez PARAS, LPN Outcome: Adequate for Discharge   Problem: Clinical Measurements: Goal: Ability to maintain clinical measurements within normal limits will improve 11/23/2023 0955 by Joshua Andrez PARAS, LPN Outcome: Adequate for Discharge 11/23/2023 0955 by Joshua Andrez PARAS, LPN Outcome: Adequate for Discharge Goal: Will remain free from infection 11/23/2023 0955 by Joshua Andrez PARAS, LPN Outcome: Adequate for Discharge 11/23/2023 0955 by Joshua Andrez PARAS, LPN Outcome: Adequate for Discharge Goal: Diagnostic test results will improve 11/23/2023 0955 by Joshua Andrez PARAS, LPN Outcome: Adequate for Discharge 11/23/2023 0955 by Joshua Andrez PARAS, LPN Outcome: Adequate for Discharge Goal: Respiratory complications will improve 11/23/2023 0955 by Joshua Andrez PARAS, LPN Outcome: Adequate for Discharge 11/23/2023 0955 by Joshua Andrez PARAS, LPN Outcome: Adequate for Discharge Goal: Cardiovascular complication will be avoided 11/23/2023 0955 by Joshua Andrez PARAS, LPN Outcome: Adequate for Discharge 11/23/2023 0955 by Joshua Andrez PARAS, LPN Outcome: Adequate for Discharge   Problem: Activity: Goal: Risk for activity intolerance will decrease 11/23/2023 0955 by Joshua Andrez PARAS, LPN Outcome: Adequate for Discharge 11/23/2023 0955 by Joshua Andrez PARAS, LPN Outcome: Adequate for Discharge   Problem: Nutrition: Goal: Adequate nutrition will be maintained 11/23/2023 0955 by Joshua Andrez PARAS,  LPN Outcome: Adequate for Discharge 11/23/2023 0955 by Joshua Andrez PARAS, LPN Outcome: Adequate for Discharge   Problem: Coping: Goal: Level of anxiety will decrease 11/23/2023 0955 by Joshua Andrez PARAS, LPN Outcome: Adequate for Discharge 11/23/2023 0955 by Joshua Andrez PARAS, LPN Outcome: Adequate for Discharge   Problem: Elimination: Goal: Will not experience complications related to bowel motility 11/23/2023 0955 by Joshua Andrez PARAS, LPN Outcome: Adequate for Discharge 11/23/2023 0955 by Joshua Andrez PARAS, LPN Outcome: Adequate for Discharge Goal: Will not experience complications related to urinary retention 11/23/2023 0955 by Joshua Andrez PARAS, LPN Outcome: Adequate for Discharge 11/23/2023 0955 by Joshua Andrez PARAS, LPN Outcome: Adequate for Discharge   Problem: Pain Managment: Goal: General experience of comfort will improve and/or be controlled 11/23/2023 0955 by Joshua Andrez PARAS, LPN Outcome: Adequate for Discharge 11/23/2023 0955 by Joshua Andrez PARAS, LPN Outcome: Adequate for Discharge   Problem: Safety: Goal: Ability to remain free from injury will improve 11/23/2023 0955 by Joshua Andrez PARAS, LPN Outcome: Adequate for Discharge 11/23/2023 0955 by Joshua Andrez PARAS, LPN Outcome: Adequate for Discharge   Problem: Skin Integrity: Goal: Risk for impaired skin integrity will decrease 11/23/2023 0955 by Joshua Andrez PARAS, LPN Outcome: Adequate for Discharge 11/23/2023 0955 by Joshua Andrez PARAS, LPN Outcome: Adequate for Discharge   Problem: Education: Goal: Ability to describe self-care measures that may prevent or decrease complications (Diabetes Survival Skills Education) will improve 11/23/2023 0955 by Joshua Andrez PARAS, LPN Outcome: Adequate for Discharge 11/23/2023 0955 by Joshua Andrez PARAS, LPN Outcome: Adequate for Discharge Goal: Individualized Educational Video(s) 11/23/2023 0955 by Joshua Andrez PARAS, LPN Outcome: Adequate for Discharge 11/23/2023 0955 by Joshua Andrez PARAS, LPN Outcome:  Adequate for Discharge   Problem: Coping: Goal: Ability to adjust to condition or change in health will improve 11/23/2023 0955 by Joshua Andrez PARAS, LPN Outcome: Adequate for Discharge 11/23/2023 0955 by  Joshua Andrez PARAS, LPN Outcome: Adequate for Discharge   Problem: Fluid Volume: Goal: Ability to maintain a balanced intake and output will improve 11/23/2023 0955 by Joshua Andrez PARAS, LPN Outcome: Adequate for Discharge 11/23/2023 0955 by Joshua Andrez PARAS, LPN Outcome: Adequate for Discharge   Problem: Health Behavior/Discharge Planning: Goal: Ability to identify and utilize available resources and services will improve 11/23/2023 0955 by Joshua Andrez PARAS, LPN Outcome: Adequate for Discharge 11/23/2023 0955 by Joshua Andrez PARAS, LPN Outcome: Adequate for Discharge Goal: Ability to manage health-related needs will improve 11/23/2023 0955 by Joshua Andrez PARAS, LPN Outcome: Adequate for Discharge 11/23/2023 0955 by Joshua Andrez PARAS, LPN Outcome: Adequate for Discharge   Problem: Metabolic: Goal: Ability to maintain appropriate glucose levels will improve 11/23/2023 0955 by Joshua Andrez PARAS, LPN Outcome: Adequate for Discharge 11/23/2023 0955 by Joshua Andrez PARAS, LPN Outcome: Adequate for Discharge   Problem: Nutritional: Goal: Maintenance of adequate nutrition will improve 11/23/2023 0955 by Joshua Andrez PARAS, LPN Outcome: Adequate for Discharge 11/23/2023 0955 by Joshua Andrez PARAS, LPN Outcome: Adequate for Discharge Goal: Progress toward achieving an optimal weight will improve 11/23/2023 0955 by Joshua Andrez PARAS, LPN Outcome: Adequate for Discharge 11/23/2023 0955 by Joshua Andrez PARAS, LPN Outcome: Adequate for Discharge   Problem: Skin Integrity: Goal: Risk for impaired skin integrity will decrease 11/23/2023 0955 by Joshua Andrez PARAS, LPN Outcome: Adequate for Discharge 11/23/2023 0955 by Joshua Andrez PARAS, LPN Outcome: Adequate for Discharge   Problem: Tissue Perfusion: Goal: Adequacy of tissue  perfusion will improve 11/23/2023 0955 by Joshua Andrez PARAS, LPN Outcome: Adequate for Discharge 11/23/2023 0955 by Joshua Andrez PARAS, LPN Outcome: Adequate for Discharge   Problem: Education: Goal: Ability to verbalize activity precautions or restrictions will improve 11/23/2023 0955 by Joshua Andrez PARAS, LPN Outcome: Adequate for Discharge 11/23/2023 0955 by Joshua Andrez PARAS, LPN Outcome: Adequate for Discharge Goal: Knowledge of the prescribed therapeutic regimen will improve 11/23/2023 0955 by Joshua Andrez PARAS, LPN Outcome: Adequate for Discharge 11/23/2023 0955 by Joshua Andrez PARAS, LPN Outcome: Adequate for Discharge Goal: Understanding of discharge needs will improve 11/23/2023 0955 by Joshua Andrez PARAS, LPN Outcome: Adequate for Discharge 11/23/2023 0955 by Joshua Andrez PARAS, LPN Outcome: Adequate for Discharge   Problem: Activity: Goal: Ability to avoid complications of mobility impairment will improve 11/23/2023 0955 by Joshua Andrez PARAS, LPN Outcome: Adequate for Discharge 11/23/2023 0955 by Joshua Andrez PARAS, LPN Outcome: Adequate for Discharge Goal: Ability to tolerate increased activity will improve 11/23/2023 0955 by Joshua Andrez PARAS, LPN Outcome: Adequate for Discharge 11/23/2023 0955 by Joshua Andrez PARAS, LPN Outcome: Adequate for Discharge Goal: Will remain free from falls 11/23/2023 0955 by Joshua Andrez PARAS, LPN Outcome: Adequate for Discharge 11/23/2023 0955 by Joshua Andrez PARAS, LPN Outcome: Adequate for Discharge   Problem: Bowel/Gastric: Goal: Gastrointestinal status for postoperative course will improve 11/23/2023 0955 by Joshua Andrez PARAS, LPN Outcome: Adequate for Discharge 11/23/2023 0955 by Joshua Andrez PARAS, LPN Outcome: Adequate for Discharge   Problem: Clinical Measurements: Goal: Ability to maintain clinical measurements within normal limits will improve 11/23/2023 0955 by Joshua Andrez PARAS, LPN Outcome: Adequate for Discharge 11/23/2023 0955 by Joshua Andrez PARAS, LPN Outcome:  Adequate for Discharge Goal: Postoperative complications will be avoided or minimized 11/23/2023 0955 by Joshua Andrez PARAS, LPN Outcome: Adequate for Discharge 11/23/2023 0955 by Joshua Andrez PARAS, LPN Outcome: Adequate for Discharge Goal: Diagnostic test results will improve 11/23/2023 0955 by Joshua Andrez PARAS, LPN Outcome: Adequate for Discharge 11/23/2023 0955 by Moira Umholtz  J, LPN Outcome: Adequate for Discharge   Problem: Pain Management: Goal: Pain level will decrease 11/23/2023 0955 by Joshua Andrez PARAS, LPN Outcome: Adequate for Discharge 11/23/2023 0955 by Joshua Andrez PARAS, LPN Outcome: Adequate for Discharge   Problem: Skin Integrity: Goal: Will show signs of wound healing 11/23/2023 0955 by Joshua Andrez PARAS, LPN Outcome: Adequate for Discharge 11/23/2023 0955 by Joshua Andrez PARAS, LPN Outcome: Adequate for Discharge   Problem: Health Behavior/Discharge Planning: Goal: Identification of resources available to assist in meeting health care needs will improve 11/23/2023 0955 by Joshua Andrez PARAS, LPN Outcome: Adequate for Discharge 11/23/2023 0955 by Joshua Andrez PARAS, LPN Outcome: Adequate for Discharge   Problem: Bladder/Genitourinary: Goal: Urinary functional status for postoperative course will improve 11/23/2023 0955 by Joshua Andrez PARAS, LPN Outcome: Adequate for Discharge 11/23/2023 0955 by Joshua Andrez PARAS, LPN Outcome: Adequate for Discharge

## 2023-11-23 NOTE — Discharge Summary (Signed)
 Physician Discharge Summary  Patient ID: Mary Barry MRN: [id number] DOB/AGE: [birth year] 43 y.o.  Admit date: 11/20/2023 Discharge date: 11/23/2023  Admission Diagnoses:   Cervical radiculopathy   Cervical stenosis of spinal canal   Pseudarthrosis following spinal fusion  Discharge Diagnoses:  Principal Problem:   S/P cervical spinal fusion Active Problems:   Cervical myelopathy (HCC)   Cervical radiculopathy   Cervical stenosis of spinal canal   Pseudarthrosis following spinal fusion   Discharged Condition: good  Hospital Course: Ms. Reis Pienta presented to the hospital for surgical intervention.  She tolerated the procedure well, was admitted for observation and treatment afterwards.  She worked with physical and Occupational Therapy and was felt to be stable for discharge on postoperative day 3.  Her drain output have been too significant to consider removal of her drain prior to postoperative day 3.  Consults: None  Significant Diagnostic Studies: radiology: X-Ray: localization and confirmation of implant placement  Treatments: surgery: C5-7 posterior fusion and decompression  Discharge Exam: Blood pressure 132/71, pulse 78, temperature 97.8 F (36.6 C), resp. rate 18, height 5' 7 (1.702 m), weight 116.4 kg, SpO2 100%. General appearance: alert and cooperative CNI MAEW  Disposition: Discharge disposition: 01-Home or Self Care       Discharge Instructions     Discharge patient   Complete by: As directed    Discharge disposition: 01-Home or Self Care   Discharge patient date: 11/23/2023   Face-to-face encounter (required for Medicare/Medicaid patients)   Complete by: As directed    I REEVES DAISY certify that this patient is under my care and that I, or a nurse practitioner or physician's assistant working with me, had a face-to-face encounter that meets the physician face-to-face encounter requirements with this patient on 11/23/2023. The  encounter with the patient was in whole, or in part for the following medical condition(s) which is the primary reason for home health care (List medical condition): cervical radiculopathy   The encounter with the patient was in whole, or in part, for the following medical condition, which is the primary reason for home health care: cervical radiculopathy   I certify that, based on my findings, the following services are medically necessary home health services: Physical therapy   Reason for Medically Necessary Home Health Services: Therapy- Home [facility] to Facilitate Safety   My clinical findings support the need for the above services: Pain interferes with ambulation/mobility   Further, I certify that my clinical findings support that this patient is homebound due to: Pain interferes with ambulation/mobility   Home Health   Complete by: As directed    To provide the following care/treatments:  PT OT     Incentive spirometry RT   Complete by: As directed       Allergies as of 11/23/2023       Reactions   Latex Rash        Medication List     TAKE these medications    acetaminophen 500 MG tablet Commonly known as: TYLENOL Take 2 tablets (1,000 mg total) by mouth every 6 (six) hours.   aspirin EC 81 MG tablet Take 81 mg by mouth daily. Swallow whole.   celecoxib 200 MG capsule Commonly known as: CELEBREX Take 1 capsule (200 mg total) by mouth 2 (two) times daily.   cyclobenzaprine 10 MG tablet Commonly known as: FLEXERIL Take 1 tablet (10 mg total) by mouth 3 (three) times daily as needed for muscle spasms.   Dexcom G6 Sensor Misc  Apply topically.   Dexcom G6 Transmitter Misc SMARTSIG:1 Every 3 Months   diazepam  2 MG tablet Commonly known as: Valium  Take 1 tablet (2 mg total) by mouth once as needed for up to 2 doses for anxiety (prior to CT scan).   fluticasone 50 MCG/ACT nasal spray Commonly known as: FLONASE Place 2 sprays into both nostrils daily as  needed for allergies.   gabapentin 300 MG capsule Commonly known as: NEURONTIN Take 300 mg by mouth 3 (three) times daily.   HumaLOG 100 UNIT/ML cartridge Generic drug: insulin lispro USE UP TO 100 UNITS DAILY VIA OMNIPOD INSULIN PUMP   Magnesium Gluconate 550 MG Tabs Take 550 mg by mouth daily.   metFORMIN 500 MG 24 hr tablet Commonly known as: GLUCOPHAGE-XR Take 1,000 mg by mouth daily with breakfast.   metoprolol tartrate 25 MG tablet Commonly known as: LOPRESSOR Take 25 mg by mouth 2 (two) times daily.   Multi-Vitamin tablet Take 1 tablet by mouth daily.   nortriptyline  10 MG capsule Commonly known as: PAMELOR  Take 1 capsule (10 mg total) by mouth at bedtime. Take 2 tabs at bedtime What changed:  how much to take additional instructions   Omnipod 5 DexG7G6 Pods Gen 5 Misc APPLY 1 POD AS DIRECTED AND REPLACE POD EVERY 48 HOURS.   ondansetron 4 MG tablet Commonly known as: ZOFRAN Take 4-8 mg by mouth every 8 (eight) hours as needed for vomiting or nausea.   oxyCODONE 5 MG immediate release tablet Commonly known as: Oxy IR/ROXICODONE Take 1-2 tablets (5-10 mg total) by mouth every 4 (four) hours as needed for moderate pain (pain score 4-6) or severe pain (pain score 7-10).   pantoprazole 40 MG tablet Commonly known as: PROTONIX Take 40 mg by mouth daily.   polyethylene glycol 17 g packet Commonly known as: MIRALAX / GLYCOLAX Take 17 g by mouth daily as needed for moderate constipation.   senna 8.6 MG Tabs tablet Commonly known as: SENOKOT Take 1 tablet (8.6 mg total) by mouth 2 (two) times daily as needed for mild constipation.   sucralfate 1 g tablet Commonly known as: CARAFATE Take 1 g by mouth daily.   Super Biotin 5 MG Tabs Generic drug: Biotin Take 5 mg by mouth daily.               Durable Medical Equipment  (From admission, onward)           Start     Ordered   11/23/23 0944  For home use only DME Walker rolling  Once        Question Answer Comment  Walker: With 5 Inch Wheels   Patient needs a walker to treat with the following condition Cervical radiculopathy      11/23/23 0944            Follow-up Information     Hilma Hastings, PA-C Follow up on 12/03/2023.   Specialty: Neurosurgery Why: 1030 AM Contact information: [address] [place] [address] [phone]                 Signed: REEVES DAISY 11/23/2023, 9:44 AM

## 2023-11-23 NOTE — TOC Progression Note (Signed)
 Transition of Care [facility]) - Progression Note    Patient Details  Name: Mary Barry MRN: [id number] Date of Birth: 1980/10/19  [facility]) CM/SW Contact  Marinda Cooks, RN Phone Number: 11/23/2023, 11:47 AM  Clinical Narrative:    This CM updated by covering MD pt medically cleared to dc today and has active DC order . This CM spoke with  [facility] Admission liaison @  [facility] arranged. DME arranged with [employer] and scheduled for RW to be delivered bedside .   DC transportation confirmed for pt with ..... Medical team updated . No additional DC needs requested by medical team or identified by CM at this time .      Barriers to Discharge: No Barriers Identified    Expected Discharge Plan and Services         Expected Discharge Date: 11/23/23               DME Arranged: Vannie rolling DME Agency: [employer]     Representative spoke with at DME Agency: [facility] Arranged: RN, PT, OT, Speech Therapy HH Agency: [facility]         Social Drivers of Health (SDOH) Interventions SDOH Screenings   Food Insecurity: No Food Insecurity (11/20/2023)  Housing: Low Risk  (11/20/2023)  Transportation Needs: No Transportation Needs (11/20/2023)  Utilities: Not At Risk (11/20/2023)  Financial Resource Strain: High Risk (10/08/2022)   Received from [facility]  Physical Activity: Sufficiently Active (02/27/2021)   Received from [facility]  Stress: No Stress Concern Present (02/27/2021)   Received from [facility]  Tobacco Use: Medium Risk (11/20/2023)    Readmission Risk Interventions     No data to display

## 2023-11-23 NOTE — Progress Notes (Signed)
 Neurosurgery Progress Note  History: Mary Barry is here for cervical stenosis and pseudoarthrosis  POD3: Doing better. POD2: Continued pain, somewhat unsteady with PT today POD1: Expected pain, doing well  Physical Exam: Vitals:   11/23/23 0343 11/23/23 0835  BP: 130/77 132/71  Pulse: 79 78  Resp: 17 18  Temp:  97.8 F (36.6 C)  SpO2: 100% 100%    AA Ox3 CNI  Strength:5/5 throughout BUE  Data:  Other tests/results: drain 50 since yesterday morning  Assessment/Plan:  Mary Barry is doing as expected after posterior cervical decompression and fusion.  - mobilize - pain control - DVT prophylaxis - PTOT - dc drain  Reeves Daisy MD, [facility] Department of Neurosurgery

## 2023-11-23 NOTE — Progress Notes (Signed)
 Physical Therapy Treatment Patient Details Name: Mary Barry MRN: [id number] DOB: 1980-04-26 Today's Date: 11/23/2023   History of Present Illness Mary Barry is 43 y.o. female presenting with cervical stenosis and pseudoarthrosis now s/p posterior cervical decompression and fusion on 11/20/23. PMH significant for DM, diabetic gastroparesis, cardiac hypertrophy, GERD, HTN, HLD, gastric ulcers.    PT Comments  Pt was seated EOB with RN giving meds upon arrival. She remains A and O x 4 an endorsing eagerness to DC home. Reports minimal (2/10)pain. Demonstrated safe abilities to stand aand ambulate ~ 80 ft prior to requesting seated rest. Used step to gait pattern due to RLE weakness/slight buckling. Pt did perform ascending/descending stairs with CGA. Performed 4 stairs with +1 RUE on railing and LUE HHA. Encouraged pt to use gait belt + BUE support when doing stairs. Pt states understanding and remains eager to DC home. HHPT recommend in addition to BRW.    If plan is discharge home, recommend the following: A little help with bathing/dressing/bathroom;Assist for transportation;Help with stairs or ramp for entrance;A lot of help with walking and/or transfers;Assistance with cooking/housework     Equipment Recommendations  Rolling walker (2 wheels) (Bariatric RW due to pt's body habitus not wt.)       Precautions / Restrictions Precautions Precautions: Cervical;Fall Precaution Booklet Issued: Yes (comment) Recall of Precautions/Restrictions: Intact Precaution/Restrictions Comments: no brace required Restrictions Weight Bearing Restrictions Per Provider Order: No     Mobility  Bed Mobility  General bed mobility comments: pt was seated EOB upon arrival.    Transfers Overall transfer level: Needs assistance Equipment used: Rolling walker (2 wheels) (Bariatric) Transfers: Sit to/from Stand Sit to Stand: Supervision, Contact guard assist   Ambulation/Gait Ambulation/Gait  assistance: Contact guard assist Gait Distance (Feet): 80 Feet Assistive device: Rolling walker (2 wheels) (Bariatric) Gait Pattern/deviations: Step-to pattern, Decreased step length - right, Decreased stance time - left Gait velocity: decreased  General Gait Details: Pt ambulated ~ 80 ft prior to requesting seated rest   Stairs Stairs: Yes Stairs assistance: Contact guard assist Stair Management: Two rails, Step to pattern, Forwards Number of Stairs: 4 General stair comments: Pt was able to ascend/descend 4 steps with BUE support. Has one rail at home and will be using son HHA for opposite UE support.    Balance Overall balance assessment: Needs assistance Sitting-balance support: Feet supported Sitting balance-Leahy Scale: Normal     Standing balance support: Bilateral upper extremity supported, During functional activity, Reliant on assistive device for balance Standing balance-Leahy Scale: Fair Standing balance comment: pt does have occasional knee buckling         Communication Communication Communication: No apparent difficulties  Cognition Arousal: Alert Behavior During Therapy: WFL for tasks assessed/performed   PT - Cognitive impairments: No apparent impairments      PT - Cognition Comments: Pt is A and O x 4. Eager to DC home. will have 2 sons assisting at home. Following commands: Intact      Cueing Cueing Techniques: Verbal cues, Tactile cues         Pertinent Vitals/Pain Pain Assessment Pain Assessment: 0-10 Pain Score: 2  Pain Location: neck Pain Descriptors / Indicators: Aching, Sore Pain Intervention(s): Limited activity within patient's tolerance, Monitored during session, Premedicated before session, Repositioned, Ice applied     PT Goals (current goals can now be found in the care plan section) Acute Rehab PT Goals Patient Stated Goal: go home today Progress towards PT goals: Progressing toward goals  Frequency    BID        AM-PAC PT 6 Clicks Mobility   Outcome Measure  Help needed turning from your back to your side while in a flat bed without using bedrails?: None Help needed moving from lying on your back to sitting on the side of a flat bed without using bedrails?: A Little Help needed moving to and from a bed to a chair (including a wheelchair)?: A Little Help needed standing up from a chair using your arms (e.g., wheelchair or bedside chair)?: A Little Help needed to walk in hospital room?: A Little Help needed climbing 3-5 steps with a railing? : A Little 6 Click Score: 19    End of Session Equipment Utilized During Treatment: Gait belt Activity Tolerance: Patient tolerated treatment well Patient left: in chair;with call bell/phone within reach;with chair alarm set Nurse Communication: Mobility status PT Visit Diagnosis: Unsteadiness on feet (R26.81);Other abnormalities of gait and mobility (R26.89);Difficulty in walking, not elsewhere classified (R26.2);Pain     Time: 9083-9056 PT Time Calculation (min) (ACUTE ONLY): 27 min  Charges:    $Gait Training: 8-22 mins $Therapeutic Activity: 8-22 mins PT General Charges $$ ACUTE PT VISIT: 1 Visit                    Rankin Essex PTA 11/23/23, 9:57 AM

## 2023-11-27 ENCOUNTER — Encounter: Payer: Self-pay | Admitting: Neurosurgery

## 2023-12-01 NOTE — Progress Notes (Unsigned)
   REFERRING PHYSICIAN:  Cyrus Selinda Moose, Pa-c [address] [place],  [place] [address]  DOS: 11/20/23  Posterior cervical fusion and decompression C5-C7 for myelopathy  HISTORY OF PRESENT ILLNESS: Mary Barry is 2 weeks status post above surgery. Given celebrex, flexeril, and oxycodone on discharge from the hospital.   She has a lot of soreness and stiffness in her neck. She notes some swelling. She is taking oxycodone and this makes her very sleepy. She is taking flexeril and celebrex as well.   Her preop neck pain is better. No numbness or tingling in her arms or toes! Her walking is better than it was prior to surgery.   PHYSICAL EXAMINATION:  NEUROLOGICAL:  General: In no acute distress.   Awake, alert, oriented to person, place, and time.  Pupils equal round and reactive to light.  Facial tone is symmetric.    Strength: Side Biceps Triceps Deltoid Interossei Grip Wrist Ext. Wrist Flex.  R 5 5 5 5 5 5 5   L 5 5 5 5 5 5 5    Incision c/d/I, she has mild swelling. No gross fluctuance. No redness or signs of infection.   Imaging:  Nothing new to review.   Assessment / Plan: Mary Barry is doing well s/p above surgery. Treatment options reviewed with patient and following plan made:   - We discussed activity escalation and I have advised the patient to lift up to 10 pounds until 6 weeks after surgery (until follow up with Dr. Clois).   - Reviewed wound care.  - Continue current medications including prn oxycodone. She will try doing 1/2 pill. If this is still too sedating, will consider changing to ultram. Hydrocodone caused her to itch in the past.  - Continue on prn flexeril and celebrex.  - Follow up as scheduled in 4 weeks and prn.   Advised to contact the office if any questions or concerns arise.   Glade Boys PA-C Dept of Neurosurgery

## 2023-12-03 ENCOUNTER — Encounter: Payer: Self-pay | Admitting: Orthopedic Surgery

## 2023-12-03 ENCOUNTER — Ambulatory Visit (INDEPENDENT_AMBULATORY_CARE_PROVIDER_SITE_OTHER): Admitting: Orthopedic Surgery

## 2023-12-03 VITALS — BP 136/82 | Temp 98.4°F | Ht 67.0 in | Wt 256.0 lb

## 2023-12-03 DIAGNOSIS — Z981 Arthrodesis status: Secondary | ICD-10-CM

## 2023-12-03 DIAGNOSIS — G959 Disease of spinal cord, unspecified: Secondary | ICD-10-CM

## 2023-12-03 DIAGNOSIS — M4802 Spinal stenosis, cervical region: Secondary | ICD-10-CM

## 2023-12-03 NOTE — Addendum Note (Signed)
 Addended byBETHA HILMA HASTINGS on: 12/03/2023 10:55 AM   Modules accepted: Orders

## 2023-12-04 ENCOUNTER — Telehealth: Payer: Self-pay | Admitting: Orthopedic Surgery

## 2023-12-04 DIAGNOSIS — M4802 Spinal stenosis, cervical region: Secondary | ICD-10-CM

## 2023-12-04 DIAGNOSIS — G959 Disease of spinal cord, unspecified: Secondary | ICD-10-CM

## 2023-12-04 DIAGNOSIS — Z981 Arthrodesis status: Secondary | ICD-10-CM

## 2023-12-04 NOTE — Telephone Encounter (Signed)
 Outpatient PT orders placed. She had very high copay for HHPT.

## 2023-12-27 ENCOUNTER — Other Ambulatory Visit: Payer: Self-pay

## 2023-12-27 DIAGNOSIS — M4802 Spinal stenosis, cervical region: Secondary | ICD-10-CM

## 2023-12-31 ENCOUNTER — Encounter: Payer: Self-pay | Admitting: Neurosurgery

## 2023-12-31 ENCOUNTER — Ambulatory Visit (INDEPENDENT_AMBULATORY_CARE_PROVIDER_SITE_OTHER)

## 2023-12-31 ENCOUNTER — Ambulatory Visit: Admitting: Neurosurgery

## 2023-12-31 VITALS — BP 118/82 | Temp 99.1°F | Ht 67.0 in | Wt 256.0 lb

## 2023-12-31 DIAGNOSIS — M4802 Spinal stenosis, cervical region: Secondary | ICD-10-CM

## 2023-12-31 DIAGNOSIS — Z981 Arthrodesis status: Secondary | ICD-10-CM | POA: Diagnosis not present

## 2023-12-31 MED ORDER — CELECOXIB 200 MG PO CAPS: 200.0000 mg | ORAL_CAPSULE | Freq: Two times a day (BID) | ORAL | 0 refills | Status: DC

## 2023-12-31 MED ORDER — CYCLOBENZAPRINE HCL 10 MG PO TABS: 10.0000 mg | ORAL_TABLET | Freq: Three times a day (TID) | ORAL | 0 refills | Status: AC | PRN

## 2023-12-31 NOTE — Progress Notes (Signed)
   REFERRING PHYSICIAN:  Cyrus Selinda Moose, Pa-c [address] [place],  [place] [address]  DOS: 11/20/23  Posterior cervical fusion and decompression C5-C7 for myelopathy  HISTORY OF PRESENT ILLNESS: Mary Barry is status post above surgery.   She is having significant pain in her trapezius muscles both when she has her bra on and when she takes it off.  She still has a lot of stiffness.  Her neck pain for which she had surgery is improved.  Her balance is improved.   PHYSICAL EXAMINATION:  NEUROLOGICAL:  General: In no acute distress.   Awake, alert, oriented to person, place, and time.  Pupils equal round and reactive to light.  Facial tone is symmetric.    Strength: Side Biceps Triceps Deltoid Interossei Grip Wrist Ext. Wrist Flex.  R 5 5 5 5 5 5 5   L 5 5 5 5 5 5 5    Incision c/d/I  Imaging:  No complications noted  Assessment / Plan: Mary Barry is doing well s/p above surgery. Treatment options reviewed with patient and following plan made:   I will reach out to Dr. Shellia him regarding breast reduction.  I think she may benefit from this.  We reviewed activity limitations.   will see her back in  6 weeks  Reeves Daisy Dept of Neurosurgery

## 2024-01-26 ENCOUNTER — Telehealth: Payer: Self-pay | Admitting: Neurosurgery

## 2024-01-26 DIAGNOSIS — G959 Disease of spinal cord, unspecified: Secondary | ICD-10-CM

## 2024-01-26 DIAGNOSIS — Z981 Arthrodesis status: Secondary | ICD-10-CM

## 2024-01-27 NOTE — Telephone Encounter (Signed)
 DOS: 11/20/23 Posterior cervical fusion and decompression C5-C7 for myelopathy   Last seen on 12/31/23 and has f/u on 02/11/24.   Recommend changing celebrex  to once a day since she is further out from surgery. Please let her know.   Refill sent to pharmacy.

## 2024-01-27 NOTE — Telephone Encounter (Signed)
 Patient advised.

## 2024-01-31 ENCOUNTER — Other Ambulatory Visit: Payer: Self-pay | Admitting: Orthopedic Surgery

## 2024-01-31 DIAGNOSIS — G959 Disease of spinal cord, unspecified: Secondary | ICD-10-CM

## 2024-02-09 NOTE — Progress Notes (Unsigned)
" ° °  REFERRING PHYSICIAN:  Cyrus Selinda Moose, Pa-c [address] [place],  [place] [address]  DOS: 11/20/23  Posterior cervical fusion and decompression C5-C7 for myelopathy  HISTORY OF PRESENT ILLNESS:  She was still having a lot of trapezial pain and stiffness at her last visit. Her balance and preop neck pain were improved. Dr. Clois discussed breast reduction with her and reached out to Dr. Lowery about this.   She is doing well regarding her neck. She has intermittent pain that is tolerable. She is in PT and this is helping. No arm pain. No numbness, tingling, or weakness.   She's had 2 migraines since her surgery and she is on tail end of migraine now. She had these prior to surgery as well.   She is scheduled to return to work at start of the year.   She continues to have pain in bra strap area and gets frequent skin tears. She has pain in her mid back as well.   PHYSICAL EXAMINATION:  NEUROLOGICAL:  General: In no acute distress.   Awake, alert, oriented to person, place, and time.  Pupils equal round and reactive to light.  Facial tone is symmetric.    Strength: Side Biceps Triceps Deltoid Interossei Grip Wrist Ext. Wrist Flex.  R 5 5 5 5 5 5 5   L 5 5 5 5 5 5 5    Incision well healed.    Imaging:  Cervical xrays dated 02/11/24:  No complications noted.  Report for above xrays not yet available.   Assessment / Plan: Mary Barry is doing well s/p above surgery. Treatment options reviewed with patient and following plan made:   - She may slowly return to activity as tolerated.  - She will continue with PT for cervical spine.  - Referral to Dr. Lowery to consider breast reduction.  - She will follow up with Dr. Clois in 6 months with repeat xrays.   Advised to contact the office if any questions or concerns arise.   Glade Boys PA-C Dept of Neurosurgery  "

## 2024-02-11 ENCOUNTER — Ambulatory Visit: Admitting: Orthopedic Surgery

## 2024-02-11 ENCOUNTER — Ambulatory Visit

## 2024-02-11 ENCOUNTER — Encounter: Payer: Self-pay | Admitting: Orthopedic Surgery

## 2024-02-11 VITALS — BP 130/82 | Temp 98.2°F | Ht 67.0 in | Wt 256.0 lb

## 2024-02-11 DIAGNOSIS — G959 Disease of spinal cord, unspecified: Secondary | ICD-10-CM

## 2024-02-11 DIAGNOSIS — Z981 Arthrodesis status: Secondary | ICD-10-CM

## 2024-02-21 NOTE — Telephone Encounter (Signed)
 Can her work note be emailed? See MyChart message.

## 2024-04-21 ENCOUNTER — Ambulatory Visit: Admitting: Plastic Surgery

## 2024-08-11 ENCOUNTER — Ambulatory Visit: Admitting: Neurosurgery
